# Patient Record
Sex: Female | Born: 1990 | Race: White | Hispanic: No | Marital: Single | Smoking: Current every day smoker
Health system: Southern US, Community
[De-identification: ages and names within clinical notes are randomized; demographics above are authoritative.]

## PROBLEM LIST (undated history)

## (undated) ENCOUNTER — Inpatient Hospital Stay (HOSPITAL_COMMUNITY): Payer: Self-pay

## (undated) DIAGNOSIS — F319 Bipolar disorder, unspecified: Secondary | ICD-10-CM

## (undated) DIAGNOSIS — O09299 Supervision of pregnancy with other poor reproductive or obstetric history, unspecified trimester: Secondary | ICD-10-CM

## (undated) DIAGNOSIS — O139 Gestational [pregnancy-induced] hypertension without significant proteinuria, unspecified trimester: Secondary | ICD-10-CM

## (undated) HISTORY — PX: TONSILLECTOMY: SUR1361

## (undated) HISTORY — PX: TUBAL LIGATION: SHX77

---
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
## 2011-12-21 ENCOUNTER — Encounter (HOSPITAL_COMMUNITY): Payer: Self-pay | Admitting: *Deleted

## 2011-12-21 ENCOUNTER — Inpatient Hospital Stay (HOSPITAL_COMMUNITY): Payer: Self-pay

## 2011-12-21 ENCOUNTER — Inpatient Hospital Stay (HOSPITAL_COMMUNITY)
Admission: EM | Admit: 2011-12-21 | Discharge: 2011-12-21 | Disposition: A | Discharge status: Home / Self Care | Payer: Self-pay | Attending: Obstetrics and Gynecology | Admitting: Obstetrics and Gynecology

## 2011-12-21 DIAGNOSIS — Z3689 Encounter for other specified antenatal screening: Secondary | ICD-10-CM

## 2011-12-21 DIAGNOSIS — O4700 False labor before 37 completed weeks of gestation, unspecified trimester: Secondary | ICD-10-CM | POA: Insufficient documentation

## 2011-12-21 DIAGNOSIS — O99891 Other specified diseases and conditions complicating pregnancy: Secondary | ICD-10-CM | POA: Insufficient documentation

## 2011-12-21 DIAGNOSIS — Z3490 Encounter for supervision of normal pregnancy, unspecified, unspecified trimester: Secondary | ICD-10-CM

## 2011-12-21 HISTORY — DX: Bipolar disorder, unspecified: F31.9

## 2011-12-21 LAB — URINALYSIS, ROUTINE W REFLEX MICROSCOPIC
Bilirubin Urine: NEGATIVE
Ketones, ur: NEGATIVE mg/dL
Leukocytes, UA: NEGATIVE
Nitrite: NEGATIVE
Protein, ur: NEGATIVE mg/dL
Urobilinogen, UA: 1 mg/dL (ref 0.0–1.0)

## 2011-12-21 MED ORDER — LACTATED RINGERS IV SOLN
INTRAVENOUS | Status: DC
  Administered 2011-12-21: 06:00:00 via INTRAVENOUS

## 2011-12-21 NOTE — ED Notes (Signed)
[facility] transported patient to [facility].

## 2011-12-21 NOTE — MAU Provider Note (Signed)
  History     CSN: [id number]  Arrival date and time: 12/21/11 4098   First Provider Initiated Contact with Patient 12/21/11 0550      Chief Complaint  Patient presents with  . Abdominal Pain  . [redacted] weeks pregnant   . Rupture of Membranes   HPI This is a 21 y.o. female at [redacted]w[redacted]d who presents via [facility] from [facility] ED.  Was in bathroom straining for BM and passed a "white glob" of mucous.  No leaking of watery fluid. No bleeding. Has some contractions which she feels in her back. Has been 2-3 cm in office. Gets care in [place], [place].  FHR was reportedly 115 baseline rate and nonreactive there, so they recommended she come here for evaluation. Dr Claiborne Billings responsible.   OB History    Grav Para Term Preterm Abortions TAB SAB Ect Mult Living   3 2 1 1      2       Past Medical History  Diagnosis Date  . Hypertension   . Manic-depressive disorder     Past Surgical History  Procedure Date  . Tonsillectomy     History reviewed. No pertinent family history.  History  Substance Use Topics  . Smoking status: Current Everyday Smoker -- 0.5 packs/day    Types: Cigarettes  . Smokeless tobacco: Not on file  . Alcohol Use: No    Allergies: No Known Allergies  No prescriptions prior to admission    ROS As in HPI  Physical Exam   Blood pressure 109/60, pulse 57, temperature 97 F (36.1 C), temperature source Oral, resp. rate 16, SpO2 100.00%.  Physical Exam  Constitutional: She is oriented to person, place, and time. She appears well-developed and well-nourished. No distress.  HENT:  Head: Normocephalic.  Cardiovascular: Normal rate.   Respiratory: Effort normal.  GI: Soft. She exhibits no distension and no mass. There is no tenderness. There is no rebound and no guarding.  Genitourinary: Uterus normal. Vaginal discharge (milky white) found.       +/- ferning.  Not typical of amniotic fern  Amnisure collected  Musculoskeletal: Normal range of motion. She exhibits no  edema.  Neurological: She is alert and oriented to person, place, and time.  Skin: Skin is warm and dry.  Psychiatric: She has a normal mood and affect.   FHR 120-125 baseline with several prolonged accelerations noted to 140-150 >> Reactive UCs irregular about every 8-10 minutes  MAU Course  Procedures  Assessment and Plan  A:  SIUP at 105w2d       Mucous plug      Reactive FHR tracing P:  Discharge home.       Discussed with pt and FOB      They will followup with their doctor at home.  Wynelle Bourgeois 12/21/2011, 6:22 AM   Just before coming off monitor, there were 3 sharp variable decels lasting only 20-30 seconds Will get BPP and AFI. Discussed with Dr Claiborne Billings.  Care turned over to [employer] CNM who will review results.

## 2011-12-21 NOTE — ED Notes (Signed)
Pt states one hour ago, went to bathroom and "thinks water broke", saw white "mucus like stuff".  C/o lower back pain that wraps around to abdomen.  Pain is intermittent and irregular.  3rd pregnancy

## 2011-12-21 NOTE — MAU Note (Signed)
Pt unable to respond to questions asked by the nurse. Pt sleeping, mother at bedside. Says patient took 1/2 tab of hydrocodone this morning and is sure she did not take more than that. Plan of care discussed with mom

## 2011-12-21 NOTE — ED Provider Notes (Signed)
History     CSN: [id number]  Arrival date & time 12/21/11  4098   First MD Initiated Contact with Patient 12/21/11 0408      Chief Complaint  Patient presents with  . Abdominal Pain  . [redacted] weeks pregnant     (Consider location/radiation/quality/duration/timing/severity/associated sxs/prior treatment) HPI Pt is G3 P2 at 35 weeks p/w ? water breaking and irregular contractions form back moving to stomach. She has had none since arriving to her room. No fever, chills. +active fetal movement. Past Medical History  Diagnosis Date  . Hypertension     Past Surgical History  Procedure Date  . Tonsillectomy     History reviewed. No pertinent family history.  History  Substance Use Topics  . Smoking status: Current Everyday Smoker -- 0.5 packs/day  . Smokeless tobacco: Not on file  . Alcohol Use: No    OB History    Grav Para Term Preterm Abortions TAB SAB Ect Mult Living                  Review of Systems  Constitutional: Negative for fever and chills.  Gastrointestinal: Positive for abdominal pain. Negative for nausea, vomiting and diarrhea.  Genitourinary: Positive for vaginal discharge and pelvic pain. Negative for vaginal bleeding and vaginal pain.  Musculoskeletal: Positive for back pain.    Allergies  Review of patient's allergies indicates no known allergies.  Home Medications  No current outpatient prescriptions on file.  BP 166/62  Pulse 74  Temp 98.1 F (36.7 C) (Oral)  Resp 14  SpO2 99%  Physical Exam  Nursing note and vitals reviewed. Constitutional: She is oriented to person, place, and time. She appears well-developed and well-nourished. No distress.  HENT:  Head: Normocephalic and atraumatic.  Mouth/Throat: Oropharynx is clear and moist.  Eyes: EOM are normal. Pupils are equal, round, and reactive to light.  Neck: Normal range of motion. Neck supple.  Cardiovascular: Normal rate and regular rhythm.   Pulmonary/Chest: Effort normal and  breath sounds normal. No respiratory distress. She has no wheezes. She has no rales.  Abdominal: Soft. Bowel sounds are normal. She exhibits distension and mass. There is no tenderness. There is no rebound and no guarding.       Gravid NT abd with visible fetal movements  Musculoskeletal: Normal range of motion. She exhibits no edema and no tenderness.  Neurological: She is alert and oriented to person, place, and time.  Skin: Skin is warm and dry. No rash noted. No erythema.  Psychiatric: She has a normal mood and affect. Her behavior is normal.    ED Course  Procedures (including critical care time)  Labs Reviewed - No data to display No results found.   1. Irregular contractions   2. Pregnancy     FHT 130's. Discussed with Dr Claiborne Billings. Advised delay sterile cervical check until patient has been transferred. No active contractions. Dr Claiborne Billings accepts in transfer  MDM          Loren Racer, MD 12/21/11 [phone]

## 2011-12-21 NOTE — MAU Note (Signed)
Patient is brought in by [facility]. She lives in [place] and is visiting her family in [place]. She states that after eating chinese food last evening, she started vomiting and felt a pinkish, thin discharge. She states that her mother told her to come to the hospital. She c/o mild abdominal cramping. Reports good fetal movement.

## 2011-12-21 NOTE — ED Notes (Signed)
R[facility] OB (Cathy) now at bedside.

## 2011-12-21 NOTE — ED Notes (Addendum)
Patient felt that she either passed a mucous plug tonight (about an hour ago), or had her water break.  Patient had ultrasound last Tuesday; baby was measured at 35-36 weeks; placenta was at 37 weeks (due to patient being a smoker).  Baby's head in ultrasound.  Patient was dilated 2-3 cm at last  Ultrasound reading.  Patient started taking blood pressure medications on Tuesday due to hypertension; patient reports history of pre-eclampsia with previous pregnancies. Patient reporting lower back pain; feels that contractions are irregular.  This is patient's 3rd pregnancy (previous labors 36 hours, and 9 hours).  Patient reports lots of fetal movement.  Dr. Ranae Palms at bedside.  Upon arrival to room, patient changed into gown and connected to continuous cardiac, pulse ox, and blood pressure monitor.  Bedside fetal monitoring set up at bedside.  Will continue to monitor.

## 2011-12-21 NOTE — ED Notes (Signed)
Patient being transferred to [facility]; [facility] called by [employer] operational officer.

## 2011-12-25 DIAGNOSIS — I10 Essential (primary) hypertension: Secondary | ICD-10-CM

## 2013-02-14 ENCOUNTER — Emergency Department (HOSPITAL_COMMUNITY): Payer: Medicaid - Out of State

## 2013-02-14 ENCOUNTER — Emergency Department (HOSPITAL_COMMUNITY)
Admission: EM | Admit: 2013-02-14 | Discharge: 2013-02-14 | Disposition: A | Discharge status: Home / Self Care | Payer: Medicaid - Out of State | Attending: Emergency Medicine | Admitting: Emergency Medicine

## 2013-02-14 ENCOUNTER — Encounter (HOSPITAL_COMMUNITY): Payer: Self-pay | Admitting: Emergency Medicine

## 2013-02-14 DIAGNOSIS — S9030XA Contusion of unspecified foot, initial encounter: Secondary | ICD-10-CM | POA: Insufficient documentation

## 2013-02-14 DIAGNOSIS — S92302A Fracture of unspecified metatarsal bone(s), left foot, initial encounter for closed fracture: Secondary | ICD-10-CM

## 2013-02-14 DIAGNOSIS — S139XXA Sprain of joints and ligaments of unspecified parts of neck, initial encounter: Secondary | ICD-10-CM | POA: Insufficient documentation

## 2013-02-14 DIAGNOSIS — R509 Fever, unspecified: Secondary | ICD-10-CM | POA: Insufficient documentation

## 2013-02-14 DIAGNOSIS — S161XXA Strain of muscle, fascia and tendon at neck level, initial encounter: Secondary | ICD-10-CM

## 2013-02-14 DIAGNOSIS — Y92410 Unspecified street and highway as the place of occurrence of the external cause: Secondary | ICD-10-CM | POA: Insufficient documentation

## 2013-02-14 DIAGNOSIS — Y939 Activity, unspecified: Secondary | ICD-10-CM | POA: Insufficient documentation

## 2013-02-14 DIAGNOSIS — IMO0002 Reserved for concepts with insufficient information to code with codable children: Secondary | ICD-10-CM | POA: Insufficient documentation

## 2013-02-14 DIAGNOSIS — F17200 Nicotine dependence, unspecified, uncomplicated: Secondary | ICD-10-CM | POA: Insufficient documentation

## 2013-02-14 DIAGNOSIS — S4980XA Other specified injuries of shoulder and upper arm, unspecified arm, initial encounter: Secondary | ICD-10-CM | POA: Insufficient documentation

## 2013-02-14 DIAGNOSIS — S46909A Unspecified injury of unspecified muscle, fascia and tendon at shoulder and upper arm level, unspecified arm, initial encounter: Secondary | ICD-10-CM | POA: Insufficient documentation

## 2013-02-14 LAB — CBC WITH DIFFERENTIAL/PLATELET
Basophils Absolute: 0 10*3/uL (ref 0.0–0.1)
Basophils Relative: 0 % (ref 0–1)
Eosinophils Absolute: 0.1 10*3/uL (ref 0.0–0.7)
Eosinophils Relative: 2 % (ref 0–5)
HCT: 40.8 % (ref 36.0–46.0)
Hemoglobin: 14.5 g/dL (ref 12.0–15.0)
Lymphocytes Relative: 33 % (ref 12–46)
Lymphs Abs: 3.1 10*3/uL (ref 0.7–4.0)
MCH: 32.9 pg (ref 26.0–34.0)
MCHC: 35.5 g/dL (ref 30.0–36.0)
MCV: 92.5 fL (ref 78.0–100.0)
Monocytes Absolute: 0.9 10*3/uL (ref 0.1–1.0)
Monocytes Relative: 10 % (ref 3–12)
Neutro Abs: 5.3 10*3/uL (ref 1.7–7.7)
Neutrophils Relative %: 56 % (ref 43–77)
Platelets: 331 10*3/uL (ref 150–400)
RBC: 4.41 MIL/uL (ref 3.87–5.11)
RDW: 12 % (ref 11.5–15.5)
WBC: 9.5 10*3/uL (ref 4.0–10.5)

## 2013-02-14 LAB — BASIC METABOLIC PANEL
BUN: 5 mg/dL — ABNORMAL LOW (ref 6–23)
CO2: 26 mEq/L (ref 19–32)
Chloride: 99 mEq/L (ref 96–112)
GFR calc non Af Amer: >90 mL/min (ref >90)
Glucose, Bld: 94 mg/dL (ref 70–99)
Potassium: 3.4 mEq/L — ABNORMAL LOW (ref 3.5–5.1)
Sodium: 137 mEq/L (ref 135–145)

## 2013-02-14 MED ORDER — IBUPROFEN 800 MG PO TABS
ORAL_TABLET | ORAL | Status: AC
  Administered 2013-02-14: 800 mg via ORAL
  Filled 2013-02-14: qty 1

## 2013-02-14 MED ORDER — OXYCODONE-ACETAMINOPHEN 5-325 MG PO TABS: 2.0000 | ORAL_TABLET | ORAL | Status: DC | PRN

## 2013-02-14 MED ORDER — IBUPROFEN 800 MG PO TABS
800.0000 mg | ORAL_TABLET | Freq: Once | ORAL | Status: AC
  Administered 2013-02-14: 800 mg via ORAL

## 2013-02-14 MED ORDER — MORPHINE SULFATE 4 MG/ML IJ SOLN
4.0000 mg | Freq: Once | INTRAMUSCULAR | Status: AC
  Administered 2013-02-14: 4 mg via INTRAVENOUS
  Filled 2013-02-14: qty 1

## 2013-02-14 MED ORDER — MORPHINE SULFATE 4 MG/ML IJ SOLN
INTRAMUSCULAR | Status: AC
  Filled 2013-02-14: qty 1

## 2013-02-14 MED ORDER — ONDANSETRON HCL 4 MG/2ML IJ SOLN
4.0000 mg | Freq: Once | INTRAMUSCULAR | Status: AC
  Administered 2013-02-14: 4 mg via INTRAMUSCULAR
  Filled 2013-02-14: qty 2

## 2013-02-14 NOTE — ED Notes (Signed)
Patient with no complaints at this time. Respirations even and unlabored. Skin warm/dry. Discharge instructions reviewed with patient at this time. Patient given opportunity to voice concerns/ask questions. IV removed per policy and band-aid applied to site. Patient discharged at this time and left Emergency Department with steady gait.  

## 2013-02-14 NOTE — ED Notes (Signed)
Aspen collar applied using spinal precautions
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
## 2013-02-14 NOTE — ED Provider Notes (Signed)
CT scan of left foot reveals a nondisplaced fracture of the third metatarsal. This was discussed with patient and her husband. She has orthopedic followup  Donnetta Hutching, MD 02/14/13 1157

## 2013-02-23 ENCOUNTER — Encounter (HOSPITAL_COMMUNITY): Payer: Self-pay | Admitting: *Deleted

## 2014-02-28 ENCOUNTER — Encounter (HOSPITAL_COMMUNITY): Payer: Self-pay | Admitting: *Deleted

## 2014-08-18 ENCOUNTER — Emergency Department
Admit: 2014-08-18 | Disposition: A | Discharge status: Home / Self Care | Payer: Self-pay | Admitting: Emergency Medicine

## 2014-08-18 LAB — URINALYSIS, COMPLETE
BACTERIA: NONE SEEN
BLOOD: NEGATIVE
Bilirubin,UR: NEGATIVE
GLUCOSE, UR: NEGATIVE mg/dL (ref 0–75)
Ketone: NEGATIVE
LEUKOCYTE ESTERASE: NEGATIVE
NITRITE: NEGATIVE
PH: 5 (ref 4.5–8.0)
Protein: NEGATIVE
SPECIFIC GRAVITY: 1.029 (ref 1.003–1.030)

## 2014-08-18 LAB — COMPREHENSIVE METABOLIC PANEL
ALBUMIN: 4.5 g/dL
ALK PHOS: 40 U/L
ALT: 12 U/L — AB
ANION GAP: 6 — AB (ref 7–16)
AST: 16 U/L
BUN: 12 mg/dL
Bilirubin,Total: 0.6 mg/dL
CALCIUM: 8.6 mg/dL — AB
CHLORIDE: 104 mmol/L
CO2: 27 mmol/L
Creatinine: 0.43 mg/dL — ABNORMAL LOW
EGFR (Non-African Amer.): >60
GLUCOSE: 113 mg/dL — AB
POTASSIUM: 3.8 mmol/L
SODIUM: 137 mmol/L
Total Protein: 7.3 g/dL

## 2014-08-18 LAB — CBC
HCT: 38.5 % (ref 35.0–47.0)
HGB: 12.9 g/dL (ref 12.0–16.0)
MCH: 31.4 pg (ref 26.0–34.0)
MCHC: 33.6 g/dL (ref 32.0–36.0)
MCV: 93 fL (ref 80–100)
PLATELETS: 296 10*3/uL (ref 150–440)
RBC: 4.12 10*6/uL (ref 3.80–5.20)
RDW: 12.5 % (ref 11.5–14.5)
WBC: 8 10*3/uL (ref 3.6–11.0)

## 2014-08-18 LAB — DRUG SCREEN, URINE
Amphetamines, Ur Screen: NEGATIVE
BARBITURATES, UR SCREEN: NEGATIVE
BENZODIAZEPINE, UR SCRN: POSITIVE
CANNABINOID 50 NG, UR ~~LOC~~: POSITIVE
Cocaine Metabolite,Ur ~~LOC~~: NEGATIVE
MDMA (Ecstasy)Ur Screen: NEGATIVE
METHADONE, UR SCREEN: NEGATIVE
Opiate, Ur Screen: NEGATIVE
PHENCYCLIDINE (PCP) UR S: NEGATIVE
Tricyclic, Ur Screen: NEGATIVE

## 2014-08-18 LAB — ACETAMINOPHEN LEVEL

## 2014-08-18 LAB — SALICYLATE LEVEL: Salicylates, Serum: <4 mg/dL

## 2014-08-18 LAB — ETHANOL

## 2015-02-06 ENCOUNTER — Other Ambulatory Visit: Payer: Self-pay | Admitting: Obstetrics & Gynecology

## 2015-02-06 DIAGNOSIS — O3680X0 Pregnancy with inconclusive fetal viability, not applicable or unspecified: Secondary | ICD-10-CM

## 2015-02-07 ENCOUNTER — Other Ambulatory Visit: Payer: Self-pay | Admitting: Obstetrics & Gynecology

## 2015-02-07 ENCOUNTER — Ambulatory Visit (INDEPENDENT_AMBULATORY_CARE_PROVIDER_SITE_OTHER): Payer: Medicaid Other

## 2015-02-07 DIAGNOSIS — O3680X0 Pregnancy with inconclusive fetal viability, not applicable or unspecified: Secondary | ICD-10-CM

## 2015-02-07 DIAGNOSIS — Z3A28 28 weeks gestation of pregnancy: Secondary | ICD-10-CM | POA: Diagnosis not present

## 2015-02-07 DIAGNOSIS — Z1389 Encounter for screening for other disorder: Secondary | ICD-10-CM

## 2015-02-07 DIAGNOSIS — Z36 Encounter for antenatal screening of mother: Secondary | ICD-10-CM | POA: Diagnosis not present

## 2015-02-07 NOTE — Progress Notes (Signed)
[place] 28+6wks,efw 1242 g,cephalic,afi 14.4cm,fhr 128 bpm,post pl gr 1,normal ov's bilat,cx 4.5cm,anatomy complete no obvious abn seen

## 2015-02-08 ENCOUNTER — Encounter: Payer: Medicaid Other | Admitting: Advanced Practice Midwife

## 2015-02-15 ENCOUNTER — Encounter: Payer: Self-pay | Admitting: Advanced Practice Midwife

## 2015-02-15 ENCOUNTER — Ambulatory Visit (INDEPENDENT_AMBULATORY_CARE_PROVIDER_SITE_OTHER): Payer: Medicaid Other | Admitting: Advanced Practice Midwife

## 2015-02-15 VITALS — BP 110/70 | HR 68 | Wt 123.0 lb

## 2015-02-15 DIAGNOSIS — O0993 Supervision of high risk pregnancy, unspecified, third trimester: Secondary | ICD-10-CM

## 2015-02-15 DIAGNOSIS — O09293 Supervision of pregnancy with other poor reproductive or obstetric history, third trimester: Secondary | ICD-10-CM

## 2015-02-15 DIAGNOSIS — O09299 Supervision of pregnancy with other poor reproductive or obstetric history, unspecified trimester: Secondary | ICD-10-CM | POA: Insufficient documentation

## 2015-02-15 DIAGNOSIS — O09213 Supervision of pregnancy with history of pre-term labor, third trimester: Secondary | ICD-10-CM | POA: Diagnosis not present

## 2015-02-15 DIAGNOSIS — Z331 Pregnant state, incidental: Secondary | ICD-10-CM | POA: Diagnosis not present

## 2015-02-15 DIAGNOSIS — Z3493 Encounter for supervision of normal pregnancy, unspecified, third trimester: Secondary | ICD-10-CM

## 2015-02-15 DIAGNOSIS — Z369 Encounter for antenatal screening, unspecified: Secondary | ICD-10-CM

## 2015-02-15 DIAGNOSIS — Z1389 Encounter for screening for other disorder: Secondary | ICD-10-CM | POA: Diagnosis not present

## 2015-02-15 DIAGNOSIS — Z3A30 30 weeks gestation of pregnancy: Secondary | ICD-10-CM

## 2015-02-15 DIAGNOSIS — O09893 Supervision of other high risk pregnancies, third trimester: Secondary | ICD-10-CM

## 2015-02-15 DIAGNOSIS — Z3490 Encounter for supervision of normal pregnancy, unspecified, unspecified trimester: Secondary | ICD-10-CM | POA: Insufficient documentation

## 2015-02-15 DIAGNOSIS — O0933 Supervision of pregnancy with insufficient antenatal care, third trimester: Secondary | ICD-10-CM | POA: Insufficient documentation

## 2015-02-15 LAB — POCT URINALYSIS DIPSTICK
Blood, UA: NEGATIVE
Glucose, UA: NEGATIVE
KETONES UA: NEGATIVE
LEUKOCYTES UA: NEGATIVE
Nitrite, UA: NEGATIVE

## 2015-02-15 MED ORDER — AMOXICILLIN 500 MG PO CAPS: 500.0000 mg | ORAL_CAPSULE | Freq: Three times a day (TID) | ORAL | Status: DC
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
## 2015-02-15 NOTE — Patient Instructions (Addendum)
Safe Medications in Pregnancy   Acne: Benzoyl Peroxide Salicylic Acid  Backache/Headache: Tylenol: 2 regular strength every 4 hours OR              2 Extra strength every 6 hours  Colds/Coughs/Allergies: Benadryl (alcohol free) 25 mg every 6 hours as needed Breath right strips Claritin Cepacol throat lozenges Chloraseptic throat spray Cold-Eeze- up to three times per day Cough drops, alcohol free Flonase (by prescription only) Guaifenesin Mucinex Robitussin DM (plain only, alcohol free) Saline nasal spray/drops Sudafed (pseudoephedrine) & Actifed ** use only after [redacted] weeks gestation and if you do not have high blood pressure Tylenol Vicks Vaporub Zinc lozenges Zyrtec   Constipation: Colace Ducolax suppositories Fleet enema Glycerin suppositories Metamucil Milk of magnesia Miralax Senokot Smooth move tea  Diarrhea: Kaopectate Imodium A-D  *NO pepto Bismol  Hemorrhoids: Anusol Anusol HC Preparation H Tucks  Indigestion: Tums Maalox Mylanta Zantac  Pepcid  Insomnia: Benadryl (alcohol free)  every 6 hours as needed Tylenol PM Unisom, no Gelcaps  Leg Cramps: Tums MagGel  Nausea/Vomiting:  Bonine Dramamine Emetrol Ginger extract Sea bands Meclizine  Nausea medication to take during pregnancy:  Unisom (doxylamine succinate 25 mg tablets) Take one tablet daily at bedtime. If symptoms are not adequately controlled, the dose can be increased to a maximum recommended dose of two tablets daily (1/2 tablet in the morning, 1/2 tablet mid-afternoon and one at bedtime). Vitamin B6  tablets. Take one tablet twice a day (up to 200 mg per day).  Skin Rashes: Aveeno products Benadryl cream or  every 6 hours as needed Calamine Lotion 1% cortisone cream  Yeast infection: Gyne-lotrimin 7 Monistat 7   **If taking multiple medications, please check labels to avoid duplicating the same active ingredients **take medication as directed on  the label ** Do not exceed 4000 mg of tylenol in 24 hours **Do not take medications that contain aspirin or ibuprofen    Kinesiology taping for pregnancy:  Youtube has good vidoes of "how tos" for lower back, pelvic, hip pain; swelling of feet, etc   1. Before your test, do not eat or drink anything for 8-10 hours prior to your  appointment (a small amount of water is allowed and you may take any medicines you normally take). Be sure to drink lots of water the day before. 2. When you arrive, your blood will be drawn for a 'fasting' blood sugar level.  Then you will be given a sweetened carbonated beverage to drink. You should  complete drinking this beverage within five minutes. After finishing the  beverage, you will have your blood drawn exactly 1 and 2 hours later. Having  your blood drawn on time is an important part of this test. A total of three blood  samples will be done. 3. The test takes approximately 2  hours. During the test, do not have anything to  eat or drink. Do not smoke, chew gum (not even sugarless gum) or use breath mints.  4. During the test you should remain close by and seated as much as possible and  avoid walking around. You may want to bring a book or something else to  occupy your time.  5. After your test, you may eat and drink as normal. You may want to bring a snack  to eat after the test is finished. Your provider will advise you as to the results of  this test and any follow-up if necessary  If your sugar test is positive for gestational  diabetes, you will be given an phone call and further instructions discussed. If you wish to know all of your test results before your next appointment, feel free to call the office, or look up your test results on Mychart.  (The range that the lab uses for normal values of the sugar test are not necessarily the range that is used for pregnant women; if your results are within the normal range, they are definitely normal.   However, if a value is deemed "high" by the lab, it may not be too high for a pregnant woman.  We will need to discuss the results if your value(s) fall in the "high" category).     Tdap Vaccine  It is recommended that you get the Tdap vaccine during the third trimester of EACH pregnancy to help protect your baby from getting pertussis (whooping cough)  27-36 weeks is the BEST time to do this so that you can pass the protection on to your baby. During pregnancy is better than after pregnancy, but if you are unable to get it during pregnancy it will be offered at the hospital.  You can get this vaccine at the health department or your family doctor, as well as some pharmacies.  Everyone who will be around your baby should also be up-to-date on their vaccines. Adults (who are not pregnant) only need 1 dose of Tdap during adulthood.

## 2015-02-16 LAB — GC/CHLAMYDIA PROBE AMP
Chlamydia trachomatis, NAA: NEGATIVE
NEISSERIA GONORRHOEAE BY PCR: NEGATIVE

## 2015-02-17 LAB — URINE CULTURE

## 2015-03-02 ENCOUNTER — Encounter: Payer: Medicaid Other | Admitting: Advanced Practice Midwife

## 2015-03-08 ENCOUNTER — Encounter: Payer: Medicaid Other | Admitting: Obstetrics and Gynecology

## 2015-03-10 ENCOUNTER — Inpatient Hospital Stay (HOSPITAL_COMMUNITY)
Admission: AD | Admit: 2015-03-10 | Discharge: 2015-03-12 | DRG: 781 | Disposition: A | Discharge status: Home / Self Care | Payer: Medicaid Other | Source: Ambulatory Visit | Attending: Obstetrics & Gynecology | Admitting: Obstetrics & Gynecology

## 2015-03-10 ENCOUNTER — Encounter: Payer: Self-pay | Admitting: Obstetrics and Gynecology

## 2015-03-10 ENCOUNTER — Other Ambulatory Visit: Payer: Self-pay | Admitting: Obstetrics and Gynecology

## 2015-03-10 ENCOUNTER — Encounter (HOSPITAL_COMMUNITY): Payer: Self-pay | Admitting: *Deleted

## 2015-03-10 ENCOUNTER — Ambulatory Visit (INDEPENDENT_AMBULATORY_CARE_PROVIDER_SITE_OTHER): Payer: Medicaid Other | Admitting: Obstetrics and Gynecology

## 2015-03-10 VITALS — BP 140/102 | HR 88 | Wt 131.0 lb

## 2015-03-10 DIAGNOSIS — Z1389 Encounter for screening for other disorder: Secondary | ICD-10-CM | POA: Diagnosis not present

## 2015-03-10 DIAGNOSIS — O133 Gestational [pregnancy-induced] hypertension without significant proteinuria, third trimester: Secondary | ICD-10-CM | POA: Diagnosis present

## 2015-03-10 DIAGNOSIS — F17210 Nicotine dependence, cigarettes, uncomplicated: Secondary | ICD-10-CM | POA: Diagnosis present

## 2015-03-10 DIAGNOSIS — O09893 Supervision of other high risk pregnancies, third trimester: Secondary | ICD-10-CM | POA: Diagnosis not present

## 2015-03-10 DIAGNOSIS — O0933 Supervision of pregnancy with insufficient antenatal care, third trimester: Secondary | ICD-10-CM | POA: Diagnosis not present

## 2015-03-10 DIAGNOSIS — O212 Late vomiting of pregnancy: Secondary | ICD-10-CM | POA: Diagnosis present

## 2015-03-10 DIAGNOSIS — Z3493 Encounter for supervision of normal pregnancy, unspecified, third trimester: Secondary | ICD-10-CM

## 2015-03-10 DIAGNOSIS — Z3A33 33 weeks gestation of pregnancy: Secondary | ICD-10-CM | POA: Diagnosis not present

## 2015-03-10 DIAGNOSIS — O09213 Supervision of pregnancy with history of pre-term labor, third trimester: Secondary | ICD-10-CM

## 2015-03-10 DIAGNOSIS — O99333 Smoking (tobacco) complicating pregnancy, third trimester: Secondary | ICD-10-CM | POA: Diagnosis present

## 2015-03-10 DIAGNOSIS — R030 Elevated blood-pressure reading, without diagnosis of hypertension: Secondary | ICD-10-CM | POA: Diagnosis not present

## 2015-03-10 DIAGNOSIS — Z331 Pregnant state, incidental: Secondary | ICD-10-CM | POA: Diagnosis not present

## 2015-03-10 DIAGNOSIS — O99613 Diseases of the digestive system complicating pregnancy, third trimester: Secondary | ICD-10-CM | POA: Diagnosis present

## 2015-03-10 DIAGNOSIS — O09293 Supervision of pregnancy with other poor reproductive or obstetric history, third trimester: Secondary | ICD-10-CM | POA: Diagnosis not present

## 2015-03-10 DIAGNOSIS — O09299 Supervision of pregnancy with other poor reproductive or obstetric history, unspecified trimester: Secondary | ICD-10-CM

## 2015-03-10 DIAGNOSIS — E876 Hypokalemia: Secondary | ICD-10-CM | POA: Diagnosis present

## 2015-03-10 DIAGNOSIS — O163 Unspecified maternal hypertension, third trimester: Secondary | ICD-10-CM | POA: Diagnosis not present

## 2015-03-10 DIAGNOSIS — K219 Gastro-esophageal reflux disease without esophagitis: Secondary | ICD-10-CM | POA: Diagnosis present

## 2015-03-10 LAB — COMPREHENSIVE METABOLIC PANEL
ALT: 11 U/L — AB (ref 14–54)
ANION GAP: 7 (ref 5–15)
AST: 15 U/L (ref 15–41)
Albumin: 3 g/dL — ABNORMAL LOW (ref 3.5–5.0)
Alkaline Phosphatase: 66 U/L (ref 38–126)
BUN: <5 mg/dL — ABNORMAL LOW (ref 6–20)
CHLORIDE: 107 mmol/L (ref 101–111)
CO2: 24 mmol/L (ref 22–32)
Calcium: 8.7 mg/dL — ABNORMAL LOW (ref 8.9–10.3)
Creatinine, Ser: 0.4 mg/dL — ABNORMAL LOW (ref 0.44–1.00)
GFR calc non Af Amer: >60 mL/min (ref >60)
Glucose, Bld: 112 mg/dL — ABNORMAL HIGH (ref 65–99)
POTASSIUM: 2.7 mmol/L — AB (ref 3.5–5.1)
SODIUM: 138 mmol/L (ref 135–145)
Total Bilirubin: 0.4 mg/dL (ref 0.3–1.2)
Total Protein: 6 g/dL — ABNORMAL LOW (ref 6.5–8.1)

## 2015-03-10 LAB — POCT URINALYSIS DIPSTICK
Blood, UA: NEGATIVE
Glucose, UA: NEGATIVE
KETONES UA: NEGATIVE
LEUKOCYTES UA: NEGATIVE
Nitrite, UA: NEGATIVE
PROTEIN UA: NEGATIVE

## 2015-03-10 LAB — CBC
HCT: 27.9 % — ABNORMAL LOW (ref 36.0–46.0)
Hemoglobin: 9.8 g/dL — ABNORMAL LOW (ref 12.0–15.0)
MCH: 31.6 pg (ref 26.0–34.0)
MCHC: 35.1 g/dL (ref 30.0–36.0)
MCV: 90 fL (ref 78.0–100.0)
PLATELETS: 214 10*3/uL (ref 150–400)
RBC: 3.1 MIL/uL — AB (ref 3.87–5.11)
RDW: 12.9 % (ref 11.5–15.5)
WBC: 8.8 10*3/uL (ref 4.0–10.5)

## 2015-03-10 LAB — URINALYSIS, ROUTINE W REFLEX MICROSCOPIC
Bilirubin Urine: NEGATIVE
Glucose, UA: NEGATIVE mg/dL
Hgb urine dipstick: NEGATIVE
KETONES UR: NEGATIVE mg/dL
NITRITE: NEGATIVE
PH: 6.5 (ref 5.0–8.0)
Protein, ur: NEGATIVE mg/dL
Specific Gravity, Urine: 1.015 (ref 1.005–1.030)
Urobilinogen, UA: 0.2 mg/dL (ref 0.0–1.0)

## 2015-03-10 LAB — PROTEIN / CREATININE RATIO, URINE
Creatinine, Urine: 77 mg/dL
PROTEIN CREATININE RATIO: 0.16 mg/mg{creat} — AB (ref 0.00–0.15)
TOTAL PROTEIN, URINE: 12 mg/dL

## 2015-03-10 LAB — TYPE AND SCREEN
ABO/RH(D): A POS
ANTIBODY SCREEN: NEGATIVE

## 2015-03-10 LAB — RAPID URINE DRUG SCREEN, HOSP PERFORMED
Amphetamines: NOT DETECTED
BARBITURATES: NOT DETECTED
Benzodiazepines: NOT DETECTED
Cocaine: NOT DETECTED
Opiates: POSITIVE — AB
Tetrahydrocannabinol: POSITIVE — AB

## 2015-03-10 LAB — URINE MICROSCOPIC-ADD ON

## 2015-03-10 MED ORDER — BUTALBITAL-APAP-CAFFEINE 50-325-40 MG PO TABS
2.0000 | ORAL_TABLET | Freq: Four times a day (QID) | ORAL | Status: DC | PRN
Start: 2015-03-10 — End: 2015-03-12
  Administered 2015-03-10 – 2015-03-11 (×5): 2 via ORAL
  Filled 2015-03-10 (×5): qty 2

## 2015-03-10 MED ORDER — POTASSIUM CHLORIDE 2 MEQ/ML IV SOLN
INTRAVENOUS | Status: DC
  Administered 2015-03-11 – 2015-03-12 (×3): via INTRAVENOUS
  Filled 2015-03-10 (×6): qty 1000

## 2015-03-10 MED ORDER — DOCUSATE SODIUM 100 MG PO CAPS
100.0000 mg | ORAL_CAPSULE | Freq: Every day | ORAL | Status: DC
  Administered 2015-03-11: 100 mg via ORAL
  Filled 2015-03-10: qty 1

## 2015-03-10 MED ORDER — POTASSIUM CHLORIDE 10 MEQ/100ML IV SOLN
10.0000 meq | INTRAVENOUS | Status: DC
  Filled 2015-03-10 (×2): qty 100

## 2015-03-10 MED ORDER — POTASSIUM CHLORIDE 10 MEQ/100ML IV SOLN
10.0000 meq | INTRAVENOUS | Status: AC
  Administered 2015-03-10 – 2015-03-11 (×4): 10 meq via INTRAVENOUS
  Filled 2015-03-10 (×4): qty 100

## 2015-03-10 MED ORDER — ACETAMINOPHEN 325 MG PO TABS: 650.0000 mg | ORAL_TABLET | ORAL | Status: DC | PRN

## 2015-03-10 MED ORDER — POTASSIUM CHLORIDE 20 MEQ PO PACK: 40.0000 meq | PACK | Freq: Once | ORAL | Status: DC

## 2015-03-10 MED ORDER — PRENATAL MULTIVITAMIN CH
1.0000 | ORAL_TABLET | Freq: Every day | ORAL | Status: DC
  Administered 2015-03-11: 1 via ORAL
  Filled 2015-03-10: qty 1

## 2015-03-10 MED ORDER — ZOLPIDEM TARTRATE 5 MG PO TABS
5.0000 mg | ORAL_TABLET | Freq: Every evening | ORAL | Status: DC | PRN
  Administered 2015-03-11: 5 mg via ORAL
  Filled 2015-03-10: qty 1

## 2015-03-10 MED ORDER — SODIUM CHLORIDE 0.9 % IV BOLUS (SEPSIS)
1000.0000 mL | Freq: Once | INTRAVENOUS | Status: AC
  Administered 2015-03-10: 1000 mL via INTRAVENOUS

## 2015-03-10 MED ORDER — CALCIUM CARBONATE ANTACID 500 MG PO CHEW: 2.0000 | CHEWABLE_TABLET | ORAL | Status: DC | PRN

## 2015-03-10 NOTE — MAU Note (Signed)
Pt reports she was sent over from MD office for elevated B/P. C/O headache as well.

## 2015-03-10 NOTE — Progress Notes (Addendum)
Patient ID: Brandy Hickman, female   DOB: 02-24-1991, 24 y.o.   MRN: [id number]  WORK IN APPOINTMENT   High Risk Pregnancy Diagnosis(es):   Hx of premature birth x4 and elevated BP  G5P0403 5364w2d Estimated Date of Delivery: 04/26/15    HPI: history by patient who is erratic historian.  The patient is being seen today for ongoing management of high risk pregnancy due to hx of recurrent Preterm deliveries, [facility], has never kept appt for initial prenatal labs. Today she reports she had elevated BP this morning, taken at home, and at triage pt's BP is 140/102, during exam BP is 150/96, thereafter BP is checked again and is 150/100, then 150/98. Marland Kitchen. Pt reports she is on daily meds for her BP, however, cannot recall the name at this time. Pt reports taking her BP meds this morning. Pt also complains of severe headache (recent episode onset yesterday), n/v, and intermittent lightheadedness when going to a standing position from a sitting position. Pt further notes that she did not get any medical attention prior to her last visit to this clinic (at 30 weeks).   Patient reports good fetal movement, denies any bleeding and no rupture of membranes symptoms or regular contractions. Baby "balling up" No ;bleeding or ROM BP weight and urine results all reviewed and noted. Blood pressure 140/102, pulse 88, weight 131 lb (59.421 kg).   [place] [phone] Fetal Surveillance Testing today:  None Fundal Height:  30cm Fetal Heart rate:  153 Edema:  None Cervix: Visually long and closed  Urinalysis: Negative  Questions were answered.  Lab and sonogram results have been reviewed. Comments: not done-- Prenatal labs not ordered yet.   Assessment:  1.  Pregnancy at 4564w2d,  Estimated Date of Delivery: 04/26/15 :  Late to [facility]nc                        2.  HTN  Chronic vs Pre-E vs anxiety vs ?'; send MAU for PIH  workup    3. Suspected anxiety disorder   4.  Hx of preterm delivery    5. Hx of SIDS                         eval made more difficult by pt historian skills, and pt anxiety Medication(s) Plans:  No changes  Treatment Plan:  Obtain records for BP meds                       [facility] further testing, initial labs, PIH w/u , UDS Follow up in 1 weeks for appointment for high risk OB care if released [facility]  By signing my name below, I, Marica OtterNusrat Rahman, attest that this documentation has been prepared under the direction and in the presence of Christin BachJohn Phillip Sandler, MD. Electronically Signed: Marica OtterNusrat Rahman, ED Scribe. 03/10/2015. 12:09 PM.   I personally performed the services described in this documentation, which was SCRIBED in my presence. The recorded information has been reviewed and considered accurate. It has been edited as necessary during review. Tilda BurrowFERGUSON,Iliany Losier V, MD Total time of visit was Over 45 mins in coordination of care, greater than 50% of time such ascalling pharmacy, confirming records, reviewing records from [place], etc.

## 2015-03-10 NOTE — Progress Notes (Signed)
CRITICAL VALUE ALERT  Critical value received:  K+ 2.7   Date of notification:  03/10/15  Time of notification:  2027  Critical value read back:Yes.    Nurse who received alert:Kathy Andrey CampanileWilson   MD notified (1st page):  W.Karim,CNM  Time of first page:  2027  MD notified (2nd page):N/A    Time of second page:N/A  Responding MD:  W.Karim,CNM  Time MD responded:  2027

## 2015-03-10 NOTE — MAU Provider Note (Signed)
History   [id number]   Chief Complaint  Patient presents with  . Hypertension    HPI Brandy Hickman is a 24 y.o. female  [phone] at 6966w2d IUP sent over from office for elevated blood pressures.  In the office patient blood pressures reported as 140/102.  Pt reported to Dr. Emelda FearFerguson that blood pressures ranged from 140-150's/90-100's at home.  Pt reports "pressure at the top of my head", denies vision changes, or epigastric pain.  Denies vaginal bleeding, leaking of fluid.  +occasional contractions.  +fetal movement.  Pt also reports nausea and vomiting in past 24 hours.  Reports vomiting 3x.  Denies fever, body aches, or chills.    Pt has received insufficient prenatal care this pregnancy.  Pregnancy is complicated by late prenatal care, history of preterm delivery x 4 (33-36 weeks), ? Chronic hypertension versus gestational hypertension.     No LMP recorded (lmp unknown). Patient is pregnant.  OB History  Gravida Para Term Preterm AB SAB TAB Ectopic Multiple Living  5 4 0 4 0 0 0 0 0 3     # Outcome Date GA Lbr Len/2nd Weight Sex Delivery Anes PTL Lv  5 Current           4 Preterm 11/06/13 2220w5d  2.325 kg (5 lb 2 oz) F Vag-Spont   ND  3 Preterm 12/25/11 7523w0d  2.608 kg (5 lb 12 oz) F Vag-Spont   Y     Complications: Hypertension  2 Preterm 05/08/10 10863w3d  2.835 kg (6 lb 4 oz) M Vag-Spont   Y  1 Preterm 09/27/07 1123w0d  3.062 kg (6 lb 12 oz) M Vag-Spont   Y     Complications: Preeclampsia      Past Medical History  Diagnosis Date  . Manic-depressive disorder (HCC)     Family History  Problem Relation Age of Onset  . Clotting disorder Mother   . Spina bifida Sister   . Other Brother     Born at 26 weeks has a flute shunt in his brain  . Depression Brother   . Heart failure Maternal Grandmother   . Varicose Veins Maternal Grandmother   . Stroke Maternal Grandfather     Social History   Social History  . Marital Status: Single    Spouse Name: N/A  . Number of  Children: N/A  . Years of Education: N/A   Social History Main Topics  . Smoking status: Current Every Day Smoker -- 0.25 packs/day for 13 years    Types: Cigarettes  . Smokeless tobacco: Never Used  . Alcohol Use: No  . Drug Use: No     Comment: Smoked pot before she found out she was pregnant  . Sexual Activity: Yes    Birth Control/ Protection: None   Other Topics Concern  . None   Social History Narrative   ** Merged History Encounter **        Allergies  Allergen Reactions  . Flexeril [Cyclobenzaprine] Swelling    No current facility-administered medications on file prior to encounter.   Current Outpatient Prescriptions on File Prior to Encounter  Medication Sig Dispense Refill  . citalopram (CELEXA) 40 MG tablet Take 40 mg by mouth daily.    . Prenatal Vit-Fe Fumarate-FA (PRENATAL MULTIVITAMIN) TABS Take 1 tablet by mouth every morning.       Review of Systems  Constitutional: Negative for fever and chills.  Eyes: Negative for visual disturbance.  Gastrointestinal: Positive for nausea and vomiting. Negative for abdominal  pain and diarrhea.  Neurological: Positive for headaches. Negative for light-headedness.  All other systems reviewed and are negative.    Physical Exam   Filed Vitals:   03/10/15 1911 03/10/15 1916 03/10/15 1931  BP: 159/97 125/72 153/89  Pulse: 88 88 82  Temp: 98 F (36.7 C)    Resp: 18      Physical Exam  Constitutional: She is oriented to person, place, and time. She appears well-developed and well-nourished. No distress.  HENT:  Head: Normocephalic.  Eyes: Pupils are equal, round, and reactive to light.  Neck: Normal range of motion. Neck supple.  Cardiovascular: Normal rate and regular rhythm.   Respiratory: Effort normal and breath sounds normal.  GI: Soft. There is no tenderness.  Genitourinary: No bleeding in the vagina. Vaginal discharge (mucusy) found.  Musculoskeletal: Normal range of motion. She exhibits edema (1+  bilat pedal edema ).  Trace pedal edema  Neurological: She is alert and oriented to person, place, and time. She has normal reflexes. She displays normal reflexes.  Skin: Skin is warm and dry.    MAU Course  Procedures Results for orders placed or performed during the hospital encounter of 03/10/15 (from the past 24 hour(s))  Urinalysis, Routine w reflex microscopic (not at [facility])     Status: Abnormal   Collection Time: 03/10/15  6:50 PM  Result Value Ref Range   Color, Urine YELLOW YELLOW   APPearance CLEAR CLEAR   Specific Gravity, Urine 1.015 1.005 - 1.030   pH 6.5 5.0 - 8.0   Glucose, UA NEGATIVE NEGATIVE mg/dL   Hgb urine dipstick NEGATIVE NEGATIVE   Bilirubin Urine NEGATIVE NEGATIVE   Ketones, ur NEGATIVE NEGATIVE mg/dL   Protein, ur NEGATIVE NEGATIVE mg/dL   Urobilinogen, UA 0.2 0.0 - 1.0 mg/dL   Nitrite NEGATIVE NEGATIVE   Leukocytes, UA SMALL (A) NEGATIVE  Protein / creatinine ratio, urine     Status: Abnormal   Collection Time: 03/10/15  6:50 PM  Result Value Ref Range   Creatinine, Urine 77.00 mg/dL   Total Protein, Urine 12 mg/dL   Protein Creatinine Ratio 0.16 (H) 0.00 - 0.15 mg/mg[Cre]  Urine microscopic-add on     Status: Abnormal   Collection Time: 03/10/15  6:50 PM  Result Value Ref Range   Squamous Epithelial / LPF FEW (A) RARE   WBC, UA 3-6 <3 WBC/hpf   RBC / HPF 3-6 <3 RBC/hpf   Bacteria, UA FEW (A) RARE  CBC     Status: Abnormal   Collection Time: 03/10/15  7:45 PM  Result Value Ref Range   WBC 8.8 4.0 - 10.5 K/uL   RBC 3.10 (L) 3.87 - 5.11 MIL/uL   Hemoglobin 9.8 (L) 12.0 - 15.0 g/dL   HCT 16.1 (L) 09.6 - 04.5 %   MCV 90.0 78.0 - 100.0 fL   MCH 31.6 26.0 - 34.0 pg   MCHC 35.1 30.0 - 36.0 g/dL   RDW 40.9 81.1 - 91.4 %   Platelets 214 150 - 400 K/uL  Comprehensive metabolic panel     Status: Abnormal   Collection Time: 03/10/15  7:45 PM  Result Value Ref Range   Sodium 138 135 - 145 mmol/L   Potassium 2.7 (LL) 3.5 - 5.1 mmol/L   Chloride 107  101 - 111 mmol/L   CO2 24 22 - 32 mmol/L   Glucose, Bld 112 (H) 65 - 99 mg/dL   BUN <5 (L) 6 - 20 mg/dL   Creatinine, Ser 7.82 (L) 0.44 -  1.00 mg/dL   Calcium 8.7 (L) 8.9 - 10.3 mg/dL   Total Protein 6.0 (L) 6.5 - 8.1 g/dL   Albumin 3.0 (L) 3.5 - 5.0 g/dL   AST 15 15 - 41 U/L   ALT 11 (L) 14 - 54 U/L   Alkaline Phosphatase 66 38 - 126 U/L   Total Bilirubin 0.4 0.3 - 1.2 mg/dL   GFR calc non Af Amer >60 >60 mL/min   GFR calc Af Amer >60 >60 mL/min   Anion gap 7 5 - 15   2100 Report given to N. Wouk who will discuss with team for plan of care.    Eino Farber Kennith Gain, CNM   Assessment and Plan  24 y.o. [id number] at [redacted]w[redacted]d IUP

## 2015-03-10 NOTE — Progress Notes (Signed)
Pt worked in today for elevated BP. Pt states that she can not keep anything down.

## 2015-03-10 NOTE — H&P (Signed)
HPI Brandy Hickman is a 24 y.o. female [phone] at [redacted]w[redacted]d IUP sent over from office for elevated blood pressures. In the office patient blood pressures reported as 140/102. Pt reported to Dr. Emelda Fear that blood pressures ranged from 140-150's/90-100's at home. Pt reports "pressure at the top of my head", denies vision changes, or epigastric pain. Denies vaginal bleeding, leaking of fluid. +occasional contractions. +fetal movement. Pt also reports nausea and vomiting in past 24 hours. Reports vomiting 3x. Denies fever, body aches, or chills.   Pt has received insufficient prenatal care this pregnancy. Pregnancy is complicated by late prenatal care, history of preterm delivery x 4 (33-36 weeks), ? Chronic hypertension versus gestational hypertension.    No LMP recorded (lmp unknown). Patient is pregnant.  OB History  Gravida Para Term Preterm AB SAB TAB Ectopic Multiple Living     # Outcome Date GA Lbr Len/2nd Weight Sex Delivery Anes PTL Lv  5 Current           4 Preterm 11/06/13 [redacted]w[redacted]d  2.325 kg (5 lb 2 oz) F Vag-Spont   ND  3 Preterm 12/25/11 [redacted]w[redacted]d  2.608 kg (5 lb 12 oz) F Vag-Spont   Y   Complications: Hypertension  2 Preterm 05/08/10 [redacted]w[redacted]d  2.835 kg (6 lb 4 oz) M Vag-Spont   Y  1 Preterm 09/27/07 [redacted]w[redacted]d  3.062 kg (6 lb 12 oz) M Vag-Spont   Y   Complications: Preeclampsia      Past Medical History  Diagnosis Date  . Manic-depressive disorder (HCC)     Family History  Problem Relation Age of Onset  . Clotting disorder Mother   . Spina bifida Sister   . Other Brother     Born at 26 weeks has a flute shunt in his brain  . Depression Brother   . Heart failure Maternal Grandmother   . Varicose Veins Maternal Grandmother   . Stroke Maternal Grandfather     Social History   Social History  . Marital  Status: Single    Spouse Name: N/A  . Number of Children: N/A  . Years of Education: N/A   Social History Main Topics  . Smoking status: Current Every Day Smoker -- 0.25 packs/day for 13 years    Types: Cigarettes  . Smokeless tobacco: Never Used  . Alcohol Use: No  . Drug Use: No     Comment: Smoked pot before she found out she was pregnant  . Sexual Activity: Yes    Birth Control/ Protection: None   Other Topics Concern  . None   Social History Narrative   ** Merged History Encounter **       Allergies  Allergen Reactions  . Flexeril [Cyclobenzaprine] Swelling    No current facility-administered medications on file prior to encounter.   Current Outpatient Prescriptions on File Prior to Encounter  Medication Sig Dispense Refill  . citalopram (CELEXA) 40 MG tablet Take 40 mg by mouth daily.    . Prenatal Vit-Fe Fumarate-FA (PRENATAL MULTIVITAMIN) TABS Take 1 tablet by mouth every morning.       Review of Systems  Constitutional: Negative for fever and chills.  Eyes: Negative for visual disturbance.  Gastrointestinal: Positive for nausea and vomiting. Negative for abdominal pain and diarrhea.  Neurological: Positive for headaches. Negative for light-headedness.  All other systems reviewed and are negative.    Physical Exam   Filed Vitals:   03/10/15 1911 03/10/15 1916 03/10/15 1931  BP: 159/97 125/72 153/89  Pulse: 88 88 82  Temp: 98 F (36.7 C)    Resp: 18      Physical Exam  Constitutional: She is oriented to person, place, and time. She appears well-developed and well-nourished. No distress.  HENT:  Head: Normocephalic.  Eyes: Pupils are equal, round, and reactive to light.  Neck: Normal range of motion. Neck supple.  Cardiovascular: Normal rate and regular rhythm.  Respiratory: Effort normal and breath sounds normal.  GI: Soft. There is no  tenderness.  Genitourinary: No bleeding in the vagina. Vaginal discharge (mucusy) found.  Musculoskeletal: Normal range of motion. She exhibits edema (1+ bilat pedal edema ).  Trace pedal edema  Neurological: She is alert and oriented to person, place, and time. She has normal reflexes. She displays normal reflexes.  Skin: Skin is warm and dry.    MAU Course  Procedures  Lab Results Last 24 Hours    Results for orders placed or performed during the hospital encounter of 03/10/15 (from the past 24 hour(s))  Urinalysis, Routine w reflex microscopic (not at [facility]) Status: Abnormal   Collection Time: 03/10/15 6:50 PM  Result Value Ref Range   Color, Urine YELLOW YELLOW   APPearance CLEAR CLEAR   Specific Gravity, Urine 1.015 1.005 - 1.030   pH 6.5 5.0 - 8.0   Glucose, UA NEGATIVE NEGATIVE mg/dL   Hgb urine dipstick NEGATIVE NEGATIVE   Bilirubin Urine NEGATIVE NEGATIVE   Ketones, ur NEGATIVE NEGATIVE mg/dL   Protein, ur NEGATIVE NEGATIVE mg/dL   Urobilinogen, UA 0.2 0.0 - 1.0 mg/dL   Nitrite NEGATIVE NEGATIVE   Leukocytes, UA SMALL (A) NEGATIVE  Protein / creatinine ratio, urine Status: Abnormal   Collection Time: 03/10/15 6:50 PM  Result Value Ref Range   Creatinine, Urine 77.00 mg/dL   Total Protein, Urine 12 mg/dL   Protein Creatinine Ratio 0.16 (H) 0.00 - 0.15 mg/mg[Cre]  Urine microscopic-add on Status: Abnormal   Collection Time: 03/10/15 6:50 PM  Result Value Ref Range   Squamous Epithelial / LPF FEW (A) RARE   WBC, UA 3-6 <3 WBC/hpf   RBC / HPF 3-6 <3 RBC/hpf   Bacteria, UA FEW (A) RARE  CBC Status: Abnormal   Collection Time: 03/10/15 7:45 PM  Result Value Ref Range   WBC 8.8 4.0 - 10.5 K/uL   RBC 3.10 (L) 3.87 - 5.11 MIL/uL   Hemoglobin 9.8 (L) 12.0 - 15.0 g/dL   HCT 40.927.9 (L) 81.136.0 - 91.446.0 %   MCV 90.0 78.0 - 100.0 fL   MCH  31.6 26.0 - 34.0 pg   MCHC 35.1 30.0 - 36.0 g/dL   RDW 78.212.9 95.611.5 - 21.315.5 %   Platelets 214 150 - 400 K/uL  Comprehensive metabolic panel Status: Abnormal   Collection Time: 03/10/15 7:45 PM  Result Value Ref Range   Sodium 138 135 - 145 mmol/L   Potassium 2.7 (LL) 3.5 - 5.1 mmol/L   Chloride 107 101 - 111 mmol/L   CO2 24 22 - 32 mmol/L   Glucose, Bld 112 (H) 65 - 99 mg/dL   BUN <5 (L) 6 - 20 mg/dL   Creatinine, Ser 0.860.40 (L) 0.44 - 1.00 mg/dL   Calcium 8.7 (L) 8.9 - 10.3 mg/dL   Total Protein 6.0 (L) 6.5 - 8.1 g/dL   Albumin 3.0 (L) 3.5 - 5.0 g/dL   AST 15 15 - 41 U/L   ALT 11 (L) 14 - 54 U/L   Alkaline Phosphatase 66 38 -  126 U/L   Total Bilirubin 0.4 0.3 - 1.2 mg/dL   GFR calc non Af Amer >60 >60 mL/min   GFR calc Af Amer >60 >60 mL/min   Anion gap 7 5 - 15     2100 Report given to N. Wouk who will discuss with team for plan of care.   Eino Farber Kennith Gain, CNM   Assessment and Plan  24 y.o. [id number] at [redacted]w[redacted]d IUP  gHTN w/o significant proteinuria Hypokalemia secondary to previous vomiting Hx PTD x 4  Will admit to Antenatal 24hr urine to be collected LR w/ 40 mEq KCl + four runs KCl Will hold off on BMZ presently  Cam Hai 03/10/2015 10:36 PM

## 2015-03-11 LAB — PMP SCREEN PROFILE (10S), URINE
AMPHETAMINE SCRN UR: NEGATIVE ng/mL
Barbiturate Screen, Ur: NEGATIVE ng/mL
Benzodiazepine Screen, Urine: NEGATIVE ng/mL
CANNABINOIDS UR QL SCN: POSITIVE ng/mL
CREATININE(CRT), U: 83.3 mg/dL (ref 20.0–300.0)
Cocaine(Metab.)Screen, Urine: NEGATIVE ng/mL
Methadone Scn, Ur: NEGATIVE ng/mL
Opiate Scrn, Ur: POSITIVE ng/mL
Oxycodone+Oxymorphone Ur Ql Scn: NEGATIVE ng/mL
PCP SCRN UR: NEGATIVE ng/mL
PH UR, DRUG SCRN: 6.7 (ref 4.5–8.9)
Propoxyphene, Screen: NEGATIVE ng/mL

## 2015-03-11 LAB — PROTEIN, URINE, 24 HOUR
COLLECTION INTERVAL-UPROT: 24 h
Protein, 24H Urine: 186 mg/d — ABNORMAL HIGH (ref 50–100)
Protein, Urine: 6 mg/dL
URINE TOTAL VOLUME-UPROT: 3100 mL

## 2015-03-11 LAB — ABO/RH: ABO/RH(D): A POS

## 2015-03-11 MED ORDER — PANTOPRAZOLE SODIUM 40 MG IV SOLR
40.0000 mg | Freq: Once | INTRAVENOUS | Status: AC
  Administered 2015-03-11: 40 mg via INTRAVENOUS
  Filled 2015-03-11: qty 40

## 2015-03-11 MED ORDER — ONDANSETRON HCL 4 MG/2ML IJ SOLN
4.0000 mg | Freq: Four times a day (QID) | INTRAMUSCULAR | Status: DC | PRN
  Administered 2015-03-11 (×2): 4 mg via INTRAVENOUS
  Filled 2015-03-11 (×2): qty 2

## 2015-03-11 MED ORDER — ONDANSETRON HCL 4 MG/2ML IJ SOLN: 4.0000 mg | Freq: Four times a day (QID) | INTRAMUSCULAR | Status: DC

## 2015-03-11 MED ORDER — CITALOPRAM HYDROBROMIDE 40 MG PO TABS
40.0000 mg | ORAL_TABLET | Freq: Every day | ORAL | Status: DC
  Administered 2015-03-11: 40 mg via ORAL
  Filled 2015-03-11 (×2): qty 1

## 2015-03-11 MED ORDER — LACTATED RINGERS IV BOLUS (SEPSIS)
500.0000 mL | Freq: Once | INTRAVENOUS | Status: AC
  Administered 2015-03-11: 500 mL via INTRAVENOUS

## 2015-03-11 MED ORDER — LABETALOL HCL 5 MG/ML IV SOLN: 20.0000 mg | INTRAVENOUS | Status: DC | PRN

## 2015-03-11 MED ORDER — HYDRALAZINE HCL 20 MG/ML IJ SOLN: 10.0000 mg | Freq: Once | INTRAMUSCULAR | Status: DC | PRN

## 2015-03-11 NOTE — Progress Notes (Signed)
Pt c/o lower back and headache pain. Pt medicated with Ambien and Fioricet per orders (see MAR for admin time). Pt reports emetic episode and states that she saw whole, undigested Ambien and Fioricet pills in vomit. Pt states that she flushed the emesis. No evidence of emesis noted and no pills seen by RN. Comfort measures provided including allowing pt to ambulate in room, Kpad, egg crate mattress applied, and pt advised to take a warm shower. Pt requesting IV pain med or something help with sleep. CNM to be notified of pt's request and comfort measures.

## 2015-03-11 NOTE — Progress Notes (Signed)
RN called CNM to discuss pt's complaints, assessment, and interventions. RN to provide comfort measures and assess effectiveness of measures. If discomfort does not resolve, RN to call Dr. Shawnie PonsPratt for further orders.

## 2015-03-11 NOTE — Progress Notes (Signed)
Patient ID: Brandy Hickman, female   DOB: 12-23-1990, 24 y.o.   MRN: [id number] SUBJECTIVE: 24 yo G5P0403 at 945w3d admitted for evaluation of GHTN; hx late PTB x4 CTSP for lower and mid intermittent abdominal pains radiating to back. Similar to sharp pains she's been having periodically, but more intense. Pain associated with vomiting, position changes and FM. Believes she vomited her Fiorocet given at 1630 for H/A. Marland Kitchen. Had Zofran 4 mg  IV after the N/V episode. She also has substernal and epigastric pain and pressure feeling like she can't exhale fully. Has had heartburn but no meds for that. Headache has been continuous but waxes and wanes. Declines Tylenol. Tearful talking of SIDS loss last year.  OBJECTIVE: Afebrile. Pulse 89. BPs 119-138/72-98, most recent 124/86 SpO2 100% Gen: NAD when distracted, winces and splints abdomen in apparent pain Chest: TTP left and right sternum, not localized Lungs CTA bilat Abd: gravid, soft, some epigastric tenderness Back: neg CVAT or paraspinous TTP SVE: posterior; long; closed; -2 cephalic  IV at 125cc/h  EFM: FHR 120's, reactive; no decelerations Toco: no definite UCs, but irregular squared off spikes c/w voluntary abdominal tightening  ASSESSMENT: GHTN parameters stable RLP, reflux contributing to discomfort N/V  PLAN: Reassured. Discussed RLP relief Try Protonix IV x1 and IVF bolus 500 cc. If no symptomatic relief after 2 hrs, try Fiorocet. Zofran as ordered  Danae Orleanseirdre C Cathren Sween, CNM 03/11/2015 7:14 PM

## 2015-03-11 NOTE — Progress Notes (Signed)
Patient ID: Brandy MayaLendi Latimore, female   DOB: 1990/09/05, 24 y.o.   MRN: [id number] FACULTY PRACTICE ANTEPARTUM(COMPREHENSIVE) NOTE  Brandy Hickman is a 24 y.o. [id number] with Estimated Date of Delivery: 04/26/15   By  midtrimester ultrasound 8946w3d  who is admitted for evaluation for hypertensive disorder in pregnancy.    Fetal presentation is unsure. Length of Stay:  1  Days  Date of admission:03/10/2015  Subjective: No headaches or blurry vision or RUQ pain Patient reports the fetal movement as active. Patient reports uterine contraction  activity as none. Patient reports  vaginal bleeding as none. Patient describes fluid per vagina as None.  Vitals:  Blood pressure 121/53, pulse 73, temperature 98.5 F (36.9 C), temperature source Oral, resp. rate 18, height 5\' 2"  (1.575 m), weight 133 lb (60.328 kg). Filed Vitals:   03/10/15 2146 03/10/15 2201 03/10/15 2303 03/11/15 0625  BP: 139/72 105/58 131/60 121/53  Pulse: 77 82 62 73  Temp:   98.2 F (36.8 C) 98.5 F (36.9 C)  TempSrc:   Oral Oral  Resp:   18 18  Height:   5\' 2"  (1.575 m)   Weight:   133 lb (60.328 kg)    Physical Examination:  General appearance - alert, well appearing, and in no distress Fundal Height:  size equals dates  Extremities: extremities normal, atraumatic, no cyanosis or edema with DTRs 2+ bilaterally Membranes:intact  Fetal Monitoring:     reactive  Labs:  Results for orders placed or performed during the hospital encounter of 03/10/15 (from the past 24 hour(s))  Urinalysis, Routine w reflex microscopic ([facility])   Collection Time: 03/10/15  6:50 PM  Result Value Ref Range   Color, Urine YELLOW YELLOW   APPearance CLEAR CLEAR   Specific Gravity, Urine 1.015 1.005 - 1.030   pH 6.5 5.0 - 8.0   Glucose, UA NEGATIVE NEGATIVE mg/dL   Hgb urine dipstick NEGATIVE NEGATIVE   Bilirubin Urine NEGATIVE NEGATIVE   Ketones, ur NEGATIVE NEGATIVE mg/dL   Protein, ur NEGATIVE NEGATIVE mg/dL   Urobilinogen,  UA 0.2 0.0 - 1.0 mg/dL   Nitrite NEGATIVE NEGATIVE   Leukocytes, UA SMALL (A) NEGATIVE  Protein / creatinine ratio, urine   Collection Time: 03/10/15  6:50 PM  Result Value Ref Range   Creatinine, Urine 77.00 mg/dL   Total Protein, Urine 12 mg/dL   Protein Creatinine Ratio 0.16 (H) 0.00 - 0.15 mg/mg[Cre]  Urine microscopic-add on   Collection Time: 03/10/15  6:50 PM  Result Value Ref Range   Squamous Epithelial / LPF FEW (A) RARE   WBC, UA 3-6 <3 WBC/hpf   RBC / HPF 3-6 <3 RBC/hpf   Bacteria, UA FEW (A) RARE  Urine rapid drug screen (hosp performed)   Collection Time: 03/10/15  6:50 PM  Result Value Ref Range   Opiates POSITIVE (A) NONE DETECTED   Cocaine NONE DETECTED NONE DETECTED   Benzodiazepines NONE DETECTED NONE DETECTED   Amphetamines NONE DETECTED NONE DETECTED   Tetrahydrocannabinol POSITIVE (A) NONE DETECTED   Barbiturates NONE DETECTED NONE DETECTED  CBC   Collection Time: 03/10/15  7:45 PM  Result Value Ref Range   WBC 8.8 4.0 - 10.5 K/uL   RBC 3.10 (L) 3.87 - 5.11 MIL/uL   Hemoglobin 9.8 (L) 12.0 - 15.0 g/dL   HCT 56.227.9 (L) 13.036.0 - 86.546.0 %   MCV 90.0 78.0 - 100.0 fL   MCH 31.6 26.0 - 34.0 pg   MCHC 35.1 30.0 - 36.0 g/dL  RDW 12.9 11.5 - 15.5 %   Platelets 214 150 - 400 K/uL  Comprehensive metabolic panel   Collection Time: 03/10/15  7:45 PM  Result Value Ref Range   Sodium 138 135 - 145 mmol/L   Potassium 2.7 (LL) 3.5 - 5.1 mmol/L   Chloride 107 101 - 111 mmol/L   CO2 24 22 - 32 mmol/L   Glucose, Bld 112 (H) 65 - 99 mg/dL   BUN <5 (L) 6 - 20 mg/dL   Creatinine, Ser 8.29 (L) 0.44 - 1.00 mg/dL   Calcium 8.7 (L) 8.9 - 10.3 mg/dL   Total Protein 6.0 (L) 6.5 - 8.1 g/dL   Albumin 3.0 (L) 3.5 - 5.0 g/dL   AST 15 15 - 41 U/L   ALT 11 (L) 14 - 54 U/L   Alkaline Phosphatase 66 38 - 126 U/L   Total Bilirubin 0.4 0.3 - 1.2 mg/dL   GFR calc non Af Amer >60 >60 mL/min   GFR calc Af Amer >60 >60 mL/min   Anion gap 7 5 - 15  Type and screen [facility]  [place]   Collection Time: 03/10/15 10:37 PM  Result Value Ref Range   ABO/RH(D) A POS    Antibody Screen NEG    Sample Expiration 03/13/2015   ABO/Rh   Collection Time: 03/10/15 10:37 PM  Result Value Ref Range   ABO/RH(D) A POS   Results for orders placed or performed in visit on 03/10/15 (from the past 24 hour(s))  POCT urinalysis dipstick   Collection Time: 03/10/15 11:30 AM  Result Value Ref Range   Color, UA     Clarity, UA     Glucose, UA neg    Bilirubin, UA     Ketones, UA neg    Spec Grav, UA     Blood, UA neg    pH, UA     Protein, UA neg    Urobilinogen, UA     Nitrite, UA neg    Leukocytes, UA Negative Negative    Imaging Studies:     Medications:  Scheduled . citalopram  40 mg Oral Daily  . docusate sodium  100 mg Oral Daily  . prenatal multivitamin  1 tablet Oral Q1200   I have reviewed the patient's current medications.  ASSESSMENT: [id number] [redacted]w[redacted]d Estimated Date of Delivery: 04/26/15  Patient Active Problem List   Diagnosis Date Noted  . Hypertension during pregnancy in third trimester 03/10/2015  . Gestational hypertension w/o significant proteinuria in 3rd trimester 03/10/2015  . Late prenatal care in third trimester 02/15/2015  . Supervision of normal pregnancy 02/15/2015  . History of preterm delivery, currently pregnant in third trimester 02/15/2015  . Hx of preeclampsia, prior pregnancy, currently pregnant 02/15/2015    PLAN: BP has settled down, 24 hour urine is pending to be completed tonight If remains stable will discharge in am  Evarose Altland H 03/11/2015,7:23 AM

## 2015-03-12 DIAGNOSIS — O133 Gestational [pregnancy-induced] hypertension without significant proteinuria, third trimester: Principal | ICD-10-CM

## 2015-03-12 LAB — COMPREHENSIVE METABOLIC PANEL
ALBUMIN: 2.4 g/dL — AB (ref 3.5–5.0)
ALT: 8 U/L — AB (ref 14–54)
AST: 11 U/L — AB (ref 15–41)
Alkaline Phosphatase: 54 U/L (ref 38–126)
Anion gap: 5 (ref 5–15)
BILIRUBIN TOTAL: 0.4 mg/dL (ref 0.3–1.2)
CALCIUM: 7.7 mg/dL — AB (ref 8.9–10.3)
CO2: 23 mmol/L (ref 22–32)
CREATININE: 0.34 mg/dL — AB (ref 0.44–1.00)
Chloride: 108 mmol/L (ref 101–111)
GFR calc Af Amer: >60 mL/min (ref >60)
GFR calc non Af Amer: >60 mL/min (ref >60)
GLUCOSE: 83 mg/dL (ref 65–99)
Potassium: 3.5 mmol/L (ref 3.5–5.1)
SODIUM: 136 mmol/L (ref 135–145)
TOTAL PROTEIN: 5.2 g/dL — AB (ref 6.5–8.1)

## 2015-03-12 MED ORDER — PANTOPRAZOLE SODIUM 40 MG PO TBEC
40.0000 mg | DELAYED_RELEASE_TABLET | Freq: Once | ORAL | Status: AC
  Administered 2015-03-12: 40 mg via ORAL
  Filled 2015-03-12: qty 1

## 2015-03-12 NOTE — Discharge Instructions (Signed)
Dental Work and Pregnancy Proper dental care before, during, and after pregnancy is important for you and your baby. Pregnancy hormones can sometimes cause the gums to swell, which makes it easier for food to become trapped between teeth. The health of your teeth and gums can affect your growing baby.  DENTAL CARE RECOMMENDATIONS To help prevent infection and maintain healthy teeth and gums, a thorough oral examination is recommended for all women in the first trimester of pregnancy. Routine cleanings and examinations are recommended throughout pregnancy.  DENTAL CARE CONSIDERATIONS  Tell your dentist if you are pregnant or want to become pregnant.   If you are pregnant, routine X-ray exams should be avoided until after your baby is born. However, they do not need to be avoided if you are trying to become pregnant.   If you need an emergency procedure that includes a dental X-ray exam during pregnancy, very low levels of radiation will be emitted from the X-ray machines. Lead aprons can be used for protection of the chest, abdomen, and thyroid.  Your dentist will help you weigh the risks and benefits of dental procedures during pregnancy. If possible, it is best to have dental procedures (such as cavity fillings and crown repair) during the second trimester of pregnancy or after the baby is born.   If you and your dentist decide to postpone a procedure for any reason, your dentist can suggest treatment to reduce the chance of infection until the procedure is performed.  HOME CARE INSTRUCTIONS   Follow good oral hygiene habits at home.  Brush your teeth twice daily with fluoride toothpaste. Brush thoroughly for at least 2 minutes. Avoid strong flavored toothpastes if you have morning sickness.   Floss at least once daily.   Eat a well-balanced diet low in sugar and carbohydrates.   See your dentist for normal interval oral examinations and cleanings.   If you vomit, rinse your  mouth with water afterward.   Make a dental appointment if you experience oral problems.   Follow up with your dentist as directed.  SEEK DENTAL CARE IF:  Oral symptoms such as pain, bleeding, swelling, or inflammation develop or worsen.   You develop growths or swelling in between teeth.   You develop signs of infection, such as:   A fever of more than 100.5 F (38.1 C).   Chills.   This information is not intended to replace advice given to you by your health care provider. Make sure you discuss any questions you have with your health care provider.   Document Released: 10/03/2009 Document Revised: 02/03/2013 Document Reviewed: 09/16/2012 Elsevier Interactive Patient Education 2016 [employer] Preeclampsia and Eclampsia Preeclampsia is a serious condition that develops only during pregnancy. It is also called toxemia of pregnancy. This condition causes high blood pressure along with other symptoms, such as swelling and headaches. These may develop as the condition gets worse. Preeclampsia may occur 20 weeks or later into your pregnancy.  Diagnosing and treating preeclampsia early is very important. If not treated early, it can cause serious problems for you and your baby. One problem it can lead to is eclampsia, which is a condition that causes muscle jerking or shaking (convulsions) in the mother. Delivering your baby is the best treatment for preeclampsia or eclampsia.  RISK FACTORS The cause of preeclampsia is not known. You may be more likely to develop preeclampsia if you have certain risk factors. These include:   Being pregnant for the first time.  Having preeclampsia  in a past pregnancy.  Having a family history of preeclampsia.  Having high blood pressure.  Being pregnant with twins or triplets.  Being 71 or older.  Being African American.  Having kidney disease or diabetes.  Having medical conditions such as lupus or blood diseases.  Being very  overweight (obese). SIGNS AND SYMPTOMS  The earliest signs of preeclampsia are:  High blood pressure.  Increased protein in your urine. Your health care provider will check for this at every prenatal visit. Other symptoms that can develop include:   Severe headaches.  Sudden weight gain.  Swelling of your hands, face, legs, and feet.  Feeling sick to your stomach (nauseous) and throwing up (vomiting).  Vision problems (blurred or double vision).  Numbness in your face, arms, legs, and feet.  Dizziness.  Slurred speech.  Sensitivity to bright lights.  Abdominal pain. DIAGNOSIS  There are no screening tests for preeclampsia. Your health care provider will ask you about symptoms and check for signs of preeclampsia during your prenatal visits. You may also have tests, including:  Urine testing.  Blood testing.  Checking your baby's heart rate.  Checking the health of your baby and your placenta using images created with sound waves (ultrasound). TREATMENT  You can work out the best treatment approach together with your health care provider. It is very important to keep all prenatal appointments. If you have an increased risk of preeclampsia, you may need more frequent prenatal exams.  Your health care provider may prescribe bed rest.  You may have to eat as little salt as possible.  You may need to take medicine to lower your blood pressure if the condition does not respond to more conservative measures.  You may need to stay in the hospital if your condition is severe. There, treatment will focus on controlling your blood pressure and fluid retention. You may also need to take medicine to prevent seizures.  If the condition gets worse, your baby may need to be delivered early to protect you and the baby. You may have your labor started with medicine (be induced), or you may have a cesarean delivery.  Preeclampsia usually goes away after the baby is born. HOME CARE  INSTRUCTIONS   Only take over-the-counter or prescription medicines as directed by your health care provider.  Lie on your left side while resting. This keeps pressure off your baby.  Elevate your feet while resting.  Get regular exercise. Ask your health care provider what type of exercise is safe for you.  Avoid caffeine and alcohol.  Do not smoke.  Drink 6-8 glasses of water every day.  Eat a balanced diet that is low in salt. Do not add salt to your food.  Avoid stressful situations as much as possible.  Get plenty of rest and sleep.  Keep all prenatal appointments and tests as scheduled. SEEK MEDICAL CARE IF:  You are gaining more weight than expected.  You have any headaches, abdominal pain, or nausea.  You are bruising more than usual.  You feel dizzy or light-headed. SEEK IMMEDIATE MEDICAL CARE IF:   You develop sudden or severe swelling anywhere in your body. This usually happens in the legs.  You gain 5 lb (2.3 kg) or more in a week.  You have a severe headache, dizziness, problems with your vision, or confusion.  You have severe abdominal pain.  You have lasting nausea or vomiting.  You have a seizure.  You have trouble moving any part of your  body.  You develop numbness in your body.  You have trouble speaking.  You have any abnormal bleeding.  You develop a stiff neck.  You pass out. MAKE SURE YOU:   Understand these instructions.  Will watch your condition.  Will get help right away if you are not doing well or get worse.   This information is not intended to replace advice given to you by your health care provider. Make sure you discuss any questions you have with your health care provider.   Document Released: 04/12/2000 Document Revised: 04/20/2013 Document Reviewed: 02/05/2013 Elsevier Interactive Patient Education [employer]

## 2015-03-12 NOTE — Progress Notes (Signed)
Report given and care relinquished to P. Hart RochesterLawson, RNC.

## 2015-03-12 NOTE — Progress Notes (Signed)
Pt reports mild improvement of headache and back pain with comfort measures.

## 2015-03-12 NOTE — Discharge Summary (Signed)
Antenatal Physician Discharge Summary  Patient ID: Brandy Hickman MRN: [id number] DOB/AGE: [birth year][name] 24 y.o.  Admit date: 03/10/2015 Discharge date: 03/12/2015  Admission Diagnoses: Principal Problem:   Gestational hypertension w/o significant proteinuria in 3rd trimester Active Problems:   Late prenatal care in third trimester   History of preterm delivery, currently pregnant in third trimester   Hx of preeclampsia, prior pregnancy, currently pregnant  Discharge Diagnoses: Same  Prenatal Procedures: NST  Consults: None  Hospital Course:  This is a 24 y.o. [id number] with IUP at 7341w4d admitted for elevated BP in the office.Marland Kitchen. She was admitted and BP were followed. Her highest BP was 130/92. Labs were normal. 24 hour urine was 182 mg. She did have an episode of epigastric pain, which improved after GI cocktail. She was observed, fetal heart rate monitoring remained reassuring, and she had no signs/symptoms of progressing preterm labor or other pre-eclampsia with severe features or maternal-fetal concerns.  Her cervical exam was unchanged from admission.  She was deemed stable for discharge to home with outpatient follow up.  Discharge Exam: Temp:  [98 F (36.7 C)-99 F (37.2 C)] 98 F (36.7 C) (11/13 0649) Pulse Rate:  [43-89] 67 (11/13 0649) Resp:  [15-18] 15 (11/13 0649) BP: (119-134)/(61-98) 120/71 mmHg (11/13 0649) SpO2:  [86 %-98 %] 96 % (11/12 1828) Physical Examination: CONSTITUTIONAL: Well-developed, well-nourished female in no acute distress.  HENT:  Normocephalic, atraumatic, External right and left ear normal. Oropharynx is clear and moist EYES: Conjunctivae and EOM are normal. Pupils are equal, round, and reactive to light. No scleral icterus.  NECK: Normal range of motion, supple, no masses SKIN: Skin is warm and dry. No rash noted. Not diaphoretic. No erythema. No pallor. NEUROLGIC: Alert and oriented to person, place, and time. Normal reflexes, muscle tone  coordination. No cranial nerve deficit noted. PSYCHIATRIC: Normal mood and affect. Normal behavior. Normal judgment and thought content. CARDIOVASCULAR: Normal heart rate noted, regular rhythm RESPIRATORY: Effort and breath sounds normal, no problems with respiration noted MUSCULOSKELETAL: Normal range of motion. No edema and no tenderness. 2+ distal pulses. ABDOMEN: Soft, nontender, nondistended, gravid. CERVIX: Dilation: Fingertip Presentation: Vertex Exam by::  (Diedra CNMW)  Fetal monitoring: FHR: 125 bpm, Variability: moderate, Accelerations: Present, Decelerations: Absent  Uterine activity: irregular contractions   Significant Diagnostic Studies:  Results for orders placed or performed during the hospital encounter of 03/10/15 (from the past 168 hour(s))  Urinalysis, Routine w reflex microscopic (not [facility])   Collection Time: 03/10/15  6:50 PM  Result Value Ref Range   Color, Urine YELLOW YELLOW   APPearance CLEAR CLEAR   Specific Gravity, Urine 1.015 1.005 - 1.030   pH 6.5 5.0 - 8.0   Glucose, UA NEGATIVE NEGATIVE mg/dL   Hgb urine dipstick NEGATIVE NEGATIVE   Bilirubin Urine NEGATIVE NEGATIVE   Ketones, ur NEGATIVE NEGATIVE mg/dL   Protein, ur NEGATIVE NEGATIVE mg/dL   Urobilinogen, UA 0.2 0.0 - 1.0 mg/dL   Nitrite NEGATIVE NEGATIVE   Leukocytes, UA SMALL (A) NEGATIVE  Protein / creatinine ratio, urine   Collection Time: 03/10/15  6:50 PM  Result Value Ref Range   Creatinine, Urine 77.00 mg/dL   Total Protein, Urine 12 mg/dL   Protein Creatinine Ratio 0.16 (H) 0.00 - 0.15 mg/mg[Cre]  Urine microscopic-add on   Collection Time: 03/10/15  6:50 PM  Result Value Ref Range   Squamous Epithelial / LPF FEW (A) RARE   WBC, UA 3-6 <3 WBC/hpf   RBC / HPF 3-6 <3 RBC/hpf  Bacteria, UA FEW (A) RARE  Urine rapid drug screen (hosp performed)   Collection Time: 03/10/15  6:50 PM  Result Value Ref Range   Opiates POSITIVE (A) NONE DETECTED   Cocaine NONE DETECTED NONE  DETECTED   Benzodiazepines NONE DETECTED NONE DETECTED   Amphetamines NONE DETECTED NONE DETECTED   Tetrahydrocannabinol POSITIVE (A) NONE DETECTED   Barbiturates NONE DETECTED NONE DETECTED  CBC   Collection Time: 03/10/15  7:45 PM  Result Value Ref Range   WBC 8.8 4.0 - 10.5 K/uL   RBC 3.10 (L) 3.87 - 5.11 MIL/uL   Hemoglobin 9.8 (L) 12.0 - 15.0 g/dL   HCT 60.4 (L) 54.0 - 98.1 %   MCV 90.0 78.0 - 100.0 fL   MCH 31.6 26.0 - 34.0 pg   MCHC 35.1 30.0 - 36.0 g/dL   RDW 19.1 47.8 - 29.5 %   Platelets 214 150 - 400 K/uL  Comprehensive metabolic panel   Collection Time: 03/10/15  7:45 PM  Result Value Ref Range   Sodium 138 135 - 145 mmol/L   Potassium 2.7 (LL) 3.5 - 5.1 mmol/L   Chloride 107 101 - 111 mmol/L   CO2 24 22 - 32 mmol/L   Glucose, Bld 112 (H) 65 - 99 mg/dL   BUN <5 (L) 6 - 20 mg/dL   Creatinine, Ser 6.21 (L) 0.44 - 1.00 mg/dL   Calcium 8.7 (L) 8.9 - 10.3 mg/dL   Total Protein 6.0 (L) 6.5 - 8.1 g/dL   Albumin 3.0 (L) 3.5 - 5.0 g/dL   AST 15 15 - 41 U/L   ALT 11 (L) 14 - 54 U/L   Alkaline Phosphatase 66 38 - 126 U/L   Total Bilirubin 0.4 0.3 - 1.2 mg/dL   GFR calc non Af Amer >60 >60 mL/min   GFR calc Af Amer >60 >60 mL/min   Anion gap 7 5 - 15  Protein, urine, 24 hour   Collection Time: 03/10/15 10:30 PM  Result Value Ref Range   Urine Total Volume-UPROT 3100 mL   Collection Interval-UPROT 24 hours   Protein, Urine 6 mg/dL   Protein, 30Q Urine 657 (H) 50 - 100 mg/day  Type and screen [facility]   Collection Time: 03/10/15 10:37 PM  Result Value Ref Range   ABO/RH(D) A POS    Antibody Screen NEG    Sample Expiration 03/13/2015   ABO/Rh   Collection Time: 03/10/15 10:37 PM  Result Value Ref Range   ABO/RH(D) A POS   Comprehensive metabolic panel   Collection Time: 03/12/15  5:35 AM  Result Value Ref Range   Sodium 136 135 - 145 mmol/L   Potassium 3.5 3.5 - 5.1 mmol/L   Chloride 108 101 - 111 mmol/L   CO2 23 22 - 32 mmol/L    Glucose, Bld 83 65 - 99 mg/dL   BUN <5 (L) 6 - 20 mg/dL   Creatinine, Ser 8.46 (L) 0.44 - 1.00 mg/dL   Calcium 7.7 (L) 8.9 - 10.3 mg/dL   Total Protein 5.2 (L) 6.5 - 8.1 g/dL   Albumin 2.4 (L) 3.5 - 5.0 g/dL   AST 11 (L) 15 - 41 U/L   ALT 8 (L) 14 - 54 U/L   Alkaline Phosphatase 54 38 - 126 U/L   Total Bilirubin 0.4 0.3 - 1.2 mg/dL   GFR calc non Af Amer >60 >60 mL/min   GFR calc Af Amer >60 >60 mL/min   Anion gap 5 5 -  15  Results for orders placed or performed in visit on 03/10/15 (from the past 168 hour(s))  POCT urinalysis dipstick   Collection Time: 03/10/15 11:30 AM  Result Value Ref Range   Color, UA     Clarity, UA     Glucose, UA neg    Bilirubin, UA     Ketones, UA neg    Spec Grav, UA     Blood, UA neg    pH, UA     Protein, UA neg    Urobilinogen, UA     Nitrite, UA neg    Leukocytes, UA Negative Negative  Results for orders placed or performed in visit on 03/10/15 (from the past 168 hour(s))  Pain Management Screening Profile (10S)   Collection Time: 03/10/15  3:58 PM  Result Value Ref Range   Amphetamine Screen, Ur Negative Cutoff=1000 ng/mL   Barbiturate Screen, Ur Negative Cutoff=200 ng/mL   Benzodiazepine Screen, Urine Negative Cutoff=200 ng/mL   Cannabinoids Ur Ql Scn Positive Cutoff=20 ng/mL   Cocaine(Metab.)Screen, Urine Negative Cutoff=300 ng/mL   Opiate Scrn, Ur Positive Cutoff=300 ng/mL   Oxycodone+Oxymorphone Ur Ql Scn Negative Cutoff=100 ng/mL   PCP Scrn, Ur Negative Cutoff=25 ng/mL   Methadone Scn, Ur Negative Cutoff=300 ng/mL   Propoxyphene, Screen Negative Cutoff=300 ng/mL   Creatinine(Crt), U 83.3 20.0 - 300.0 mg/dL   Ph of Urine 6.7 4.5 - 8.9   PLEASE NOTE: Comment     Discharge Condition: Stable  Disposition: 01-Home or Self Care   Discharge Instructions    Discharge activity:  Bathroom / Shower only    Complete by:  As directed      Discharge activity: Bedrest    Complete by:  As directed      Discharge diet:  No restrictions     Complete by:  As directed      Fetal Kick Count:  Lie on our left side for one hour after a meal, and count the number of times your baby kicks.  If it is less than 5 times, get up, move around and drink some juice.  Repeat the test 30 minutes later.  If it is still less than 5 kicks in an hour, notify your doctor.    Complete by:  As directed      No sexual activity restrictions    Complete by:  As directed      Notify physician for a general feeling that "something is not right"    Complete by:  As directed      Notify physician for increase or change in vaginal discharge    Complete by:  As directed      Notify physician for intestinal cramps, with or without diarrhea, sometimes described as "gas pain"    Complete by:  As directed      Notify physician for leaking of fluid    Complete by:  As directed      Notify physician for low, dull backache, unrelieved by heat or Tylenol    Complete by:  As directed      Notify physician for menstrual like cramps    Complete by:  As directed      Notify physician for pelvic pressure    Complete by:  As directed      Notify physician for uterine contractions.  These may be painless and feel like the uterus is tightening or the baby is  "balling up"    Complete by:  As directed  Notify physician for vaginal bleeding    Complete by:  As directed      PRETERM LABOR:  Includes any of the follwing symptoms that occur between 20 - [redacted] weeks gestation.  If these symptoms are not stopped, preterm labor can result in preterm delivery, placing your baby at risk    Complete by:  As directed             Medication List    TAKE these medications        citalopram 40 MG tablet  Commonly known as:  CELEXA  Take 40 mg by mouth daily.     prenatal multivitamin Tabs tablet  Take 1 tablet by mouth every morning.           Follow-up Information    Follow up with [facility].   Specialty:  Obstetrics and Gynecology   Why:  keep next  scheduled appointment   Contact information:   [address] [place] [address] [phone]      Signed: Reva Bores M.D. 03/12/2015, 7:34 AM

## 2015-03-14 ENCOUNTER — Encounter: Payer: Medicaid Other | Admitting: Obstetrics and Gynecology

## 2015-03-14 ENCOUNTER — Encounter: Payer: Self-pay | Admitting: Obstetrics and Gynecology

## 2015-03-15 ENCOUNTER — Ambulatory Visit (INDEPENDENT_AMBULATORY_CARE_PROVIDER_SITE_OTHER): Payer: Medicaid Other | Admitting: Obstetrics and Gynecology

## 2015-03-15 VITALS — BP 138/80 | HR 84 | Wt 125.0 lb

## 2015-03-15 DIAGNOSIS — Z331 Pregnant state, incidental: Secondary | ICD-10-CM

## 2015-03-15 DIAGNOSIS — O163 Unspecified maternal hypertension, third trimester: Secondary | ICD-10-CM

## 2015-03-15 DIAGNOSIS — Z1389 Encounter for screening for other disorder: Secondary | ICD-10-CM | POA: Diagnosis not present

## 2015-03-15 DIAGNOSIS — O0933 Supervision of pregnancy with insufficient antenatal care, third trimester: Secondary | ICD-10-CM

## 2015-03-15 DIAGNOSIS — O09893 Supervision of other high risk pregnancies, third trimester: Secondary | ICD-10-CM | POA: Diagnosis not present

## 2015-03-15 DIAGNOSIS — O09293 Supervision of pregnancy with other poor reproductive or obstetric history, third trimester: Secondary | ICD-10-CM

## 2015-03-15 LAB — POCT URINALYSIS DIPSTICK
Glucose, UA: NEGATIVE
KETONES UA: NEGATIVE
LEUKOCYTES UA: NEGATIVE
Nitrite, UA: NEGATIVE
RBC UA: NEGATIVE

## 2015-03-15 MED ORDER — PROMETHAZINE HCL 12.5 MG PO TABS: 12.5000 mg | ORAL_TABLET | Freq: Four times a day (QID) | ORAL | Status: DC | PRN

## 2015-03-15 MED ORDER — OXYCODONE-ACETAMINOPHEN 5-325 MG PO TABS: 1.0000 | ORAL_TABLET | Freq: Three times a day (TID) | ORAL | Status: DC | PRN

## 2015-03-15 NOTE — Progress Notes (Signed)
Patient ID: Brandy Hickman, female   DOB: [birth year], 24 y.o.   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  Today she reports intermittent nausea and that she has trouble with her appetite. Pt has h/o drug use, opiates Rx'd allegedly by Dr Danella MaiersHolifield [place] and h/o of marijuana use. She states that marijuana helps with nausea and would like something to use for that so she does not have to smoke marijuana. She has taken Phenergan in the past with relief for the nausea. Pt has been prescribed 7.5 mg oxycodone 20 pills every 30 days by Dr. Maralyn SagoHollifield.  Patient reports good fetal movement, denies any bleeding and no rupture of membranes symptoms or regular contractions.   BP weight and urine results all reviewed and noted. Blood pressure 138/80, pulse 84, weight 125 lb (56.7 kg).  Fetal Surveillance Testing today:  none Fundal Height:  31cm Fetal Heart rate:  121 Edema:  none Baby is vertex Urinalysis: Positive for trace protein   Questions were answered.  Lab and sonogram results have been reviewed. Comments: normal   Assessment:  1.  Pregnancy at 7868w0d,  Estimated Date of Delivery: 04/26/15                         2. h/o premature birth x4 and elevated BP                        3.  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 Samvel Zinn MD Electronically Signed: Jarvis Morganaylor Laura Radilla, ED Scribe. 03/15/2015. 3:33 PM.   I personally performed the services described in this documentation, which was SCRIBED in my presence. The recorded information has been reviewed and considered accurate. It has been edited as necessary during review. Tilda BurrowFERGUSON,Aliviana Burdell V, MD

## 2015-03-15 NOTE — Progress Notes (Signed)
Pt states that she is still having some of the pressure she was the other day, pt states that she is also having some pain in her lower back. Pt states that she has bad nausea and that she has trouble with her appetite.

## 2015-03-30 ENCOUNTER — Encounter: Payer: Medicaid Other | Admitting: Obstetrics and Gynecology

## 2015-03-31 ENCOUNTER — Encounter: Payer: Self-pay | Admitting: Obstetrics and Gynecology

## 2015-03-31 ENCOUNTER — Encounter: Payer: Medicaid Other | Admitting: Obstetrics and Gynecology

## 2015-04-09 LAB — OB RESULTS CONSOLE GBS: GBS: NEGATIVE

## 2015-04-12 ENCOUNTER — Ambulatory Visit (INDEPENDENT_AMBULATORY_CARE_PROVIDER_SITE_OTHER): Payer: Medicaid Other | Admitting: Obstetrics & Gynecology

## 2015-04-12 ENCOUNTER — Encounter: Payer: Self-pay | Admitting: Obstetrics & Gynecology

## 2015-04-12 VITALS — BP 120/70 | HR 72 | Wt 131.0 lb

## 2015-04-12 DIAGNOSIS — Z331 Pregnant state, incidental: Secondary | ICD-10-CM

## 2015-04-12 DIAGNOSIS — Z3493 Encounter for supervision of normal pregnancy, unspecified, third trimester: Secondary | ICD-10-CM

## 2015-04-12 DIAGNOSIS — O163 Unspecified maternal hypertension, third trimester: Secondary | ICD-10-CM | POA: Diagnosis not present

## 2015-04-12 DIAGNOSIS — O0933 Supervision of pregnancy with insufficient antenatal care, third trimester: Secondary | ICD-10-CM

## 2015-04-12 DIAGNOSIS — Z1389 Encounter for screening for other disorder: Secondary | ICD-10-CM | POA: Diagnosis not present

## 2015-04-12 DIAGNOSIS — O09893 Supervision of other high risk pregnancies, third trimester: Secondary | ICD-10-CM

## 2015-04-12 DIAGNOSIS — O09213 Supervision of pregnancy with history of pre-term labor, third trimester: Secondary | ICD-10-CM

## 2015-04-12 DIAGNOSIS — Z3A38 38 weeks gestation of pregnancy: Secondary | ICD-10-CM

## 2015-04-12 LAB — POCT URINALYSIS DIPSTICK
GLUCOSE UA: NEGATIVE
Ketones, UA: NEGATIVE
Leukocytes, UA: NEGATIVE
NITRITE UA: NEGATIVE
Protein, UA: NEGATIVE
RBC UA: NEGATIVE

## 2015-04-12 NOTE — Progress Notes (Signed)
[id number] 6975w0d Estimated Date of Delivery: 04/26/15  Blood pressure 120/70, pulse 72, weight 131 lb (59.421 kg).   BP weight and urine results all reviewed and noted.  Please refer to the obstetrical flow sheet for the fundal height and fetal heart rate documentation:  Patient reports good fetal movement, denies any bleeding and no rupture of membranes symptoms or regular contractions. Patient is without complaints. All questions were answered.  Orders Placed This Encounter  Procedures  . POCT urinalysis dipstick    Plan:  Continued routine obstetrical care, I didn't realize until after I exmined her cervix that she had not had a Group B strep assessment done If comes back in next week will do GBS  Return in about 1 week (around 04/19/2015) for LROB.

## 2015-04-13 ENCOUNTER — Telehealth: Payer: Self-pay | Admitting: Obstetrics & Gynecology

## 2015-04-13 MED ORDER — OXYCODONE-ACETAMINOPHEN 5-325 MG PO TABS: 1.0000 | ORAL_TABLET | Freq: Three times a day (TID) | ORAL | Status: DC | PRN

## 2015-04-14 ENCOUNTER — Telehealth: Payer: Self-pay | Admitting: Obstetrics & Gynecology

## 2015-04-14 NOTE — Telephone Encounter (Signed)
Pt came into the office to pick up her medication and she requested for Dr. Despina HiddenEure to give her a call because she has a question for him since her was the last one to check her. Please contact pt

## 2015-04-16 ENCOUNTER — Inpatient Hospital Stay (HOSPITAL_COMMUNITY)
Admission: AD | Admit: 2015-04-16 | Discharge: 2015-04-16 | Disposition: A | Discharge status: Home / Self Care | Payer: Medicaid Other | Source: Ambulatory Visit | Attending: Obstetrics & Gynecology | Admitting: Obstetrics & Gynecology

## 2015-04-16 ENCOUNTER — Encounter (HOSPITAL_COMMUNITY): Payer: Self-pay | Admitting: *Deleted

## 2015-04-16 DIAGNOSIS — O09213 Supervision of pregnancy with history of pre-term labor, third trimester: Secondary | ICD-10-CM

## 2015-04-16 DIAGNOSIS — O163 Unspecified maternal hypertension, third trimester: Secondary | ICD-10-CM

## 2015-04-16 DIAGNOSIS — Z3493 Encounter for supervision of normal pregnancy, unspecified, third trimester: Secondary | ICD-10-CM | POA: Diagnosis not present

## 2015-04-16 DIAGNOSIS — O0933 Supervision of pregnancy with insufficient antenatal care, third trimester: Secondary | ICD-10-CM

## 2015-04-16 DIAGNOSIS — O09893 Supervision of other high risk pregnancies, third trimester: Secondary | ICD-10-CM

## 2015-04-16 DIAGNOSIS — O09293 Supervision of pregnancy with other poor reproductive or obstetric history, third trimester: Secondary | ICD-10-CM

## 2015-04-16 NOTE — Discharge Instructions (Signed)
Braxton Hicks Contractions °Contractions of the uterus can occur throughout pregnancy. Contractions are not always a sign that you are in labor.  °WHAT ARE BRAXTON HICKS CONTRACTIONS?  °Contractions that occur before labor are called Braxton Hicks contractions, or false labor. Toward the end of pregnancy (32-34 weeks), these contractions can develop more often and may become more forceful. This is not true labor because these contractions do not result in opening (dilatation) and thinning of the cervix. They are sometimes difficult to tell apart from true labor because these contractions can be forceful and people have different pain tolerances. You should not feel embarrassed if you go to the hospital with false labor. Sometimes, the only way to tell if you are in true labor is for your health care provider to look for changes in the cervix. °If there are no prenatal problems or other health problems associated with the pregnancy, it is completely safe to be sent home with false labor and await the onset of true labor. °HOW CAN YOU TELL THE DIFFERENCE BETWEEN TRUE AND FALSE LABOR? °False Labor °· The contractions of false labor are usually shorter and not as hard as those of true labor.   °· The contractions are usually irregular.   °· The contractions are often felt in the front of the lower abdomen and in the groin.   °· The contractions may go away when you walk around or change positions while lying down.   °· The contractions get weaker and are shorter lasting as time goes on.   °· The contractions do not usually become progressively stronger, regular, and closer together as with true labor.   °True Labor °1. Contractions in true labor last 30-70 seconds, become very regular, usually become more intense, and increase in frequency.   °2. The contractions do not go away with walking.   °3. The discomfort is usually felt in the top of the uterus and spreads to the lower abdomen and low back.   °4. True labor can  be determined by your health care provider with an exam. This will show that the cervix is dilating and getting thinner.   °WHAT TO REMEMBER °· Keep up with your usual exercises and follow other instructions given by your health care provider.   °· Take medicines as directed by your health care provider.   °· Keep your regular prenatal appointments.   °· Eat and drink lightly if you think you are going into labor.   °· If Braxton Hicks contractions are making you uncomfortable:   °· Change your position from lying down or resting to walking, or from walking to resting.   °· Sit and rest in a tub of warm water.   °· Drink 2-3 glasses of water. Dehydration may cause these contractions.   °· Do slow and deep breathing several times an hour.   °WHEN SHOULD I SEEK IMMEDIATE MEDICAL CARE? °Seek immediate medical care if: °· Your contractions become stronger, more regular, and closer together.   °· You have fluid leaking or gushing from your vagina.   °· You have a fever.   °· You pass blood-tinged mucus.   °· You have vaginal bleeding.   °· You have continuous abdominal pain.   °· You have low back pain that you never had before.   °· You feel your baby's head pushing down and causing pelvic pressure.   °· Your baby is not moving as much as it used to.   °  °This information is not intended to replace advice given to you by your health care provider. Make sure you discuss any questions you have with your health care   provider. °  °Document Released: 04/15/2005 Document Revised: 04/20/2013 Document Reviewed: 01/25/2013 °Elsevier Interactive Patient Education ©2016 [employer] ° °Fetal Movement Counts °Patient Name: __________________________________________________ Patient Due Date: ____________________ °Performing a fetal movement count is highly recommended in high-risk pregnancies, but it is good for every pregnant woman to do. Your health care provider may ask you to start counting fetal movements at 28 weeks of the  pregnancy. Fetal movements often increase: °· After eating a full meal. °· After physical activity. °· After eating or drinking something sweet or cold. °· At rest. °Pay attention to when you feel the baby is most active. This will help you notice a pattern of your baby's sleep and wake cycles and what factors contribute to an increase in fetal movement. It is important to perform a fetal movement count at the same time each day when your baby is normally most active.  °HOW TO COUNT FETAL MOVEMENTS °5. Find a quiet and comfortable area to sit or lie down on your left side. Lying on your left side provides the best blood and oxygen circulation to your baby. °6. Write down the day and time on a sheet of paper or in a journal. °7. Start counting kicks, flutters, swishes, rolls, or jabs in a 2-hour period. You should feel at least 10 movements within 2 hours. °8. If you do not feel 10 movements in 2 hours, wait 2-3 hours and count again. Look for a change in the pattern or not enough counts in 2 hours. °SEEK MEDICAL CARE IF: °· You feel less than 10 counts in 2 hours, tried twice. °· There is no movement in over an hour. °· The pattern is changing or taking longer each day to reach 10 counts in 2 hours. °· You feel the baby is not moving as he or she usually does. °Date: ____________ Movements: ____________ Start time: ____________ Finish time: ____________  °Date: ____________ Movements: ____________ Start time: ____________ Finish time: ____________ °Date: ____________ Movements: ____________ Start time: ____________ Finish time: ____________ °Date: ____________ Movements: ____________ Start time: ____________ Finish time: ____________ °Date: ____________ Movements: ____________ Start time: ____________ Finish time: ____________ °Date: ____________ Movements: ____________ Start time: ____________ Finish time: ____________ °Date: ____________ Movements: ____________ Start time: ____________ Finish time:  ____________ °Date: ____________ Movements: ____________ Start time: ____________ Finish time: ____________  °Date: ____________ Movements: ____________ Start time: ____________ Finish time: ____________ °Date: ____________ Movements: ____________ Start time: ____________ Finish time: ____________ °Date: ____________ Movements: ____________ Start time: ____________ Finish time: ____________ °Date: ____________ Movements: ____________ Start time: ____________ Finish time: ____________ °Date: ____________ Movements: ____________ Start time: ____________ Finish time: ____________ °Date: ____________ Movements: ____________ Start time: ____________ Finish time: ____________ °Date: ____________ Movements: ____________ Start time: ____________ Finish time: ____________  °Date: ____________ Movements: ____________ Start time: ____________ Finish time: ____________ °Date: ____________ Movements: ____________ Start time: ____________ Finish time: ____________ °Date: ____________ Movements: ____________ Start time: ____________ Finish time: ____________ °Date: ____________ Movements: ____________ Start time: ____________ Finish time: ____________ °Date: ____________ Movements: ____________ Start time: ____________ Finish time: ____________ °Date: ____________ Movements: ____________ Start time: ____________ Finish time: ____________ °Date: ____________ Movements: ____________ Start time: ____________ Finish time: ____________  °Date: ____________ Movements: ____________ Start time: ____________ Finish time: ____________ °Date: ____________ Movements: ____________ Start time: ____________ Finish time: ____________ °Date: ____________ Movements: ____________ Start time: ____________ Finish time: ____________ °Date: ____________ Movements: ____________ Start time: ____________ Finish time: ____________ °Date: ____________ Movements: ____________ Start time: ____________ Finish time: ____________ °Date: ____________ Movements:  ____________ Start time: ____________ Finish   time: ____________ °Date: ____________ Movements: ____________ Start time: ____________ Finish time: ____________  °Date: ____________ Movements: ____________ Start time: ____________ Finish time: ____________ °Date: ____________ Movements: ____________ Start time: ____________ Finish time: ____________ °Date: ____________ Movements: ____________ Start time: ____________ Finish time: ____________ °Date: ____________ Movements: ____________ Start time: ____________ Finish time: ____________ °Date: ____________ Movements: ____________ Start time: ____________ Finish time: ____________ °Date: ____________ Movements: ____________ Start time: ____________ Finish time: ____________ °Date: ____________ Movements: ____________ Start time: ____________ Finish time: ____________  °Date: ____________ Movements: ____________ Start time: ____________ Finish time: ____________ °Date: ____________ Movements: ____________ Start time: ____________ Finish time: ____________ °Date: ____________ Movements: ____________ Start time: ____________ Finish time: ____________ °Date: ____________ Movements: ____________ Start time: ____________ Finish time: ____________ °Date: ____________ Movements: ____________ Start time: ____________ Finish time: ____________ °Date: ____________ Movements: ____________ Start time: ____________ Finish time: ____________ °Date: ____________ Movements: ____________ Start time: ____________ Finish time: ____________  °Date: ____________ Movements: ____________ Start time: ____________ Finish time: ____________ °Date: ____________ Movements: ____________ Start time: ____________ Finish time: ____________ °Date: ____________ Movements: ____________ Start time: ____________ Finish time: ____________ °Date: ____________ Movements: ____________ Start time: ____________ Finish time: ____________ °Date: ____________ Movements: ____________ Start time: ____________ Finish  time: ____________ °Date: ____________ Movements: ____________ Start time: ____________ Finish time: ____________ °Date: ____________ Movements: ____________ Start time: ____________ Finish time: ____________  °Date: ____________ Movements: ____________ Start time: ____________ Finish time: ____________ °Date: ____________ Movements: ____________ Start time: ____________ Finish time: ____________ °Date: ____________ Movements: ____________ Start time: ____________ Finish time: ____________ °Date: ____________ Movements: ____________ Start time: ____________ Finish time: ____________ °Date: ____________ Movements: ____________ Start time: ____________ Finish time: ____________ °Date: ____________ Movements: ____________ Start time: ____________ Finish time: ____________ °  °This information is not intended to replace advice given to you by your health care provider. Make sure you discuss any questions you have with your health care provider. °  °Document Released: 05/15/2006 Document Revised: 05/06/2014 Document Reviewed: 02/10/2012 °Elsevier Interactive Patient Education ©2016 Elsevier Inc. ° °

## 2015-04-16 NOTE — MAU Note (Signed)
Pt presents to MAU for complaints of contractions. Denies any vaginal bleeding or LOF

## 2015-04-17 NOTE — Telephone Encounter (Signed)
Pt states has discussed with Dr. Despina HiddenEure while [facility]

## 2015-04-18 ENCOUNTER — Encounter: Payer: Medicaid Other | Admitting: Obstetrics & Gynecology

## 2015-04-18 ENCOUNTER — Telehealth: Payer: Self-pay | Admitting: Obstetrics & Gynecology

## 2015-04-18 NOTE — Telephone Encounter (Signed)
Pt c/o irregular back contractions, "hurting really bad" decrease FM, a lot of pressure. Pt [place] in [place] and did not want to wait too long before she called. Pt advised to come to our office now for evaluation. Pt states she will need to make arrangements for transportation. Informed pt if she could not get here before our office closed [facility], last appt time is 4 pm. Pt verbalized understanding.

## 2015-04-19 ENCOUNTER — Encounter: Payer: Self-pay | Admitting: Obstetrics and Gynecology

## 2015-04-19 ENCOUNTER — Ambulatory Visit (INDEPENDENT_AMBULATORY_CARE_PROVIDER_SITE_OTHER): Payer: Medicaid Other | Admitting: Obstetrics and Gynecology

## 2015-04-19 ENCOUNTER — Encounter: Payer: Medicaid Other | Admitting: Obstetrics & Gynecology

## 2015-04-19 VITALS — BP 110/80 | HR 76 | Wt 129.2 lb

## 2015-04-19 DIAGNOSIS — O163 Unspecified maternal hypertension, third trimester: Secondary | ICD-10-CM | POA: Diagnosis not present

## 2015-04-19 DIAGNOSIS — Z3A39 39 weeks gestation of pregnancy: Secondary | ICD-10-CM | POA: Diagnosis not present

## 2015-04-19 DIAGNOSIS — Z1389 Encounter for screening for other disorder: Secondary | ICD-10-CM | POA: Diagnosis not present

## 2015-04-19 DIAGNOSIS — Z331 Pregnant state, incidental: Secondary | ICD-10-CM

## 2015-04-19 DIAGNOSIS — O09893 Supervision of other high risk pregnancies, third trimester: Secondary | ICD-10-CM

## 2015-04-19 DIAGNOSIS — Z369 Encounter for antenatal screening, unspecified: Secondary | ICD-10-CM

## 2015-04-19 DIAGNOSIS — O09213 Supervision of pregnancy with history of pre-term labor, third trimester: Secondary | ICD-10-CM

## 2015-04-19 LAB — POCT URINALYSIS DIPSTICK
Glucose, UA: NEGATIVE
KETONES UA: NEGATIVE
Leukocytes, UA: NEGATIVE
Nitrite, UA: NEGATIVE
PROTEIN UA: NEGATIVE
RBC UA: NEGATIVE

## 2015-04-19 NOTE — Progress Notes (Signed)
Patient ID: Brandy Hickman, female   DOB: December 08, 1990, 24 y.o.   MRN: [id number]  [id number] 1470w0d Estimated Date of Delivery: 04/26/15  Blood pressure 110/80, pulse 76, weight 129 lb 3.2 oz (58.605 kg).   refer to the ob flow sheet for FH and FHR, also BP, Wt, Urine results:notable for negative  Patient reports  + good fetal movement, denies any bleeding and no rupture of membranes symptoms or regular contractions. Patient complaints:none.  FHR- 156 bpm  FH- 34 cm   Questions were answered. Assessment: LROB [id number] @ 6070w0d Group B strep obtained   Cervix 2cm; firm; -2   Plan:  Continued routine obstetrical care  F/u in 1 week for routine prenatal care    By signing my name below, I, Doreatha MartinEva Mathews, attest that this documentation has been prepared under the direction and in the presence of Tilda BurrowJohn Verley Pariseau V, MD. Electronically Signed: Doreatha MartinEva Mathews, ED Scribe. 04/19/2015. 12:05 PM.  I personally performed the services described in this documentation, which was SCRIBED in my presence. The recorded information has been reviewed and considered accurate. It has been edited as necessary during review. Tilda BurrowFERGUSON,Srishti Strnad V, MD

## 2015-04-21 LAB — STREP GP B NAA: STREP GROUP B AG: NEGATIVE

## 2015-04-26 ENCOUNTER — Encounter: Payer: Self-pay | Admitting: Obstetrics and Gynecology

## 2015-04-26 ENCOUNTER — Ambulatory Visit (INDEPENDENT_AMBULATORY_CARE_PROVIDER_SITE_OTHER): Payer: Medicaid Other | Admitting: Obstetrics and Gynecology

## 2015-04-26 VITALS — BP 142/70 | HR 84 | Wt 129.0 lb

## 2015-04-26 DIAGNOSIS — Z3493 Encounter for supervision of normal pregnancy, unspecified, third trimester: Secondary | ICD-10-CM

## 2015-04-26 DIAGNOSIS — O163 Unspecified maternal hypertension, third trimester: Secondary | ICD-10-CM

## 2015-04-26 DIAGNOSIS — Z3A40 40 weeks gestation of pregnancy: Secondary | ICD-10-CM | POA: Diagnosis not present

## 2015-04-26 DIAGNOSIS — Z331 Pregnant state, incidental: Secondary | ICD-10-CM | POA: Diagnosis not present

## 2015-04-26 DIAGNOSIS — O09893 Supervision of other high risk pregnancies, third trimester: Secondary | ICD-10-CM

## 2015-04-26 DIAGNOSIS — Z1389 Encounter for screening for other disorder: Secondary | ICD-10-CM

## 2015-04-26 LAB — POCT URINALYSIS DIPSTICK
GLUCOSE UA: NEGATIVE
KETONES UA: NEGATIVE
Leukocytes, UA: NEGATIVE
Nitrite, UA: NEGATIVE
PROTEIN UA: NEGATIVE
RBC UA: NEGATIVE

## 2015-04-26 NOTE — Progress Notes (Signed)
[id number] 6167w0d Estimated Date of Delivery: 04/26/15  Blood pressure 142/70, pulse 84, weight 129 lb (58.514 kg).   refer to the ob flow sheet for FH and FHR, also BP, Wt, Urine results:notable for neg protein  Patient reports   good fetal movement, denies any bleeding and no rupture of membranes symptoms or regular contractions. Patient complaints: requests scheduling of .IOL . [place]. Claims to be fatigued by prolonged pregnancy . Will schedule at 41 wk per protocol  Questions were answered. Assessment: LROB [id number] @ 2067w0d extra time required to counsel re IOL, and circ, and tubal scheduling at 4 wk pp I spent over 25 minutes with the visit with >than 50% spent in counseling and coordination of care.   Plan:  Continued routine obstetrical care, with IOL scheduled for 41 wk at midnight.   F/u in 1 weeks for at midnight on 3 Jan for IOL

## 2015-04-26 NOTE — Patient Instructions (Signed)
INduction scheduled for midnight on the 3 rd Jan, at exactly 41wk.

## 2015-04-28 ENCOUNTER — Inpatient Hospital Stay (HOSPITAL_COMMUNITY): Payer: Medicaid Other | Admitting: Anesthesiology

## 2015-04-28 ENCOUNTER — Encounter (HOSPITAL_COMMUNITY): Payer: Self-pay | Admitting: *Deleted

## 2015-04-28 ENCOUNTER — Inpatient Hospital Stay (HOSPITAL_COMMUNITY)
Admission: AD | Admit: 2015-04-28 | Discharge: 2015-04-30 | DRG: 775 | Disposition: A | Discharge status: Home / Self Care | Payer: Medicaid Other | Source: Ambulatory Visit | Attending: Obstetrics and Gynecology | Admitting: Obstetrics and Gynecology

## 2015-04-28 ENCOUNTER — Telehealth (HOSPITAL_COMMUNITY): Payer: Self-pay | Admitting: *Deleted

## 2015-04-28 DIAGNOSIS — O134 Gestational [pregnancy-induced] hypertension without significant proteinuria, complicating childbirth: Secondary | ICD-10-CM | POA: Diagnosis present

## 2015-04-28 DIAGNOSIS — O0933 Supervision of pregnancy with insufficient antenatal care, third trimester: Secondary | ICD-10-CM

## 2015-04-28 DIAGNOSIS — O99334 Smoking (tobacco) complicating childbirth: Secondary | ICD-10-CM | POA: Diagnosis present

## 2015-04-28 DIAGNOSIS — O4292 Full-term premature rupture of membranes, unspecified as to length of time between rupture and onset of labor: Secondary | ICD-10-CM | POA: Diagnosis present

## 2015-04-28 DIAGNOSIS — Z8249 Family history of ischemic heart disease and other diseases of the circulatory system: Secondary | ICD-10-CM | POA: Diagnosis not present

## 2015-04-28 DIAGNOSIS — Z823 Family history of stroke: Secondary | ICD-10-CM | POA: Diagnosis not present

## 2015-04-28 DIAGNOSIS — O26893 Other specified pregnancy related conditions, third trimester: Secondary | ICD-10-CM

## 2015-04-28 DIAGNOSIS — Z3A40 40 weeks gestation of pregnancy: Secondary | ICD-10-CM

## 2015-04-28 DIAGNOSIS — O09293 Supervision of pregnancy with other poor reproductive or obstetric history, third trimester: Secondary | ICD-10-CM

## 2015-04-28 DIAGNOSIS — M549 Dorsalgia, unspecified: Secondary | ICD-10-CM

## 2015-04-28 DIAGNOSIS — F17210 Nicotine dependence, cigarettes, uncomplicated: Secondary | ICD-10-CM | POA: Diagnosis present

## 2015-04-28 DIAGNOSIS — O09893 Supervision of other high risk pregnancies, third trimester: Secondary | ICD-10-CM

## 2015-04-28 DIAGNOSIS — O09213 Supervision of pregnancy with history of pre-term labor, third trimester: Secondary | ICD-10-CM

## 2015-04-28 DIAGNOSIS — Z3493 Encounter for supervision of normal pregnancy, unspecified, third trimester: Secondary | ICD-10-CM

## 2015-04-28 DIAGNOSIS — O4290 Premature rupture of membranes, unspecified as to length of time between rupture and onset of labor, unspecified weeks of gestation: Secondary | ICD-10-CM | POA: Diagnosis present

## 2015-04-28 DIAGNOSIS — O163 Unspecified maternal hypertension, third trimester: Secondary | ICD-10-CM

## 2015-04-28 HISTORY — DX: Supervision of pregnancy with other poor reproductive or obstetric history, unspecified trimester: O09.299

## 2015-04-28 HISTORY — DX: Gestational (pregnancy-induced) hypertension without significant proteinuria, unspecified trimester: O13.9

## 2015-04-28 LAB — COMPREHENSIVE METABOLIC PANEL
ALK PHOS: 123 U/L (ref 38–126)
ALT: 11 U/L — ABNORMAL LOW (ref 14–54)
ANION GAP: 10 (ref 5–15)
AST: 18 U/L (ref 15–41)
Albumin: 3.3 g/dL — ABNORMAL LOW (ref 3.5–5.0)
BILIRUBIN TOTAL: 0.5 mg/dL (ref 0.3–1.2)
BUN: <5 mg/dL — ABNORMAL LOW (ref 6–20)
CALCIUM: 10.1 mg/dL (ref 8.9–10.3)
CO2: 23 mmol/L (ref 22–32)
CREATININE: 0.38 mg/dL — AB (ref 0.44–1.00)
Chloride: 102 mmol/L (ref 101–111)
Glucose, Bld: 94 mg/dL (ref 65–99)
Potassium: 3.7 mmol/L (ref 3.5–5.1)
Sodium: 135 mmol/L (ref 135–145)
TOTAL PROTEIN: 6.9 g/dL (ref 6.5–8.1)

## 2015-04-28 LAB — DIFFERENTIAL
BASOS PCT: 0 %
Basophils Absolute: 0 10*3/uL (ref 0.0–0.1)
EOS ABS: 0.1 10*3/uL (ref 0.0–0.7)
EOS PCT: 1 %
Lymphocytes Relative: 20 %
Lymphs Abs: 2.1 10*3/uL (ref 0.7–4.0)
MONO ABS: 0.4 10*3/uL (ref 0.1–1.0)
Monocytes Relative: 4 %
Neutro Abs: 8 10*3/uL — ABNORMAL HIGH (ref 1.7–7.7)
Neutrophils Relative %: 75 %

## 2015-04-28 LAB — RAPID URINE DRUG SCREEN, HOSP PERFORMED
Amphetamines: NOT DETECTED
BARBITURATES: NOT DETECTED
Benzodiazepines: POSITIVE — AB
Cocaine: NOT DETECTED
Opiates: POSITIVE — AB
Tetrahydrocannabinol: POSITIVE — AB

## 2015-04-28 LAB — CBC
HCT: 33.5 % — ABNORMAL LOW (ref 36.0–46.0)
Hemoglobin: 11.4 g/dL — ABNORMAL LOW (ref 12.0–15.0)
MCH: 30.1 pg (ref 26.0–34.0)
MCHC: 34 g/dL (ref 30.0–36.0)
MCV: 88.4 fL (ref 78.0–100.0)
PLATELETS: 304 10*3/uL (ref 150–400)
RBC: 3.79 MIL/uL — ABNORMAL LOW (ref 3.87–5.11)
RDW: 13 % (ref 11.5–15.5)
WBC: 10.7 10*3/uL — ABNORMAL HIGH (ref 4.0–10.5)

## 2015-04-28 LAB — PROTEIN / CREATININE RATIO, URINE
Creatinine, Urine: 73 mg/dL
PROTEIN CREATININE RATIO: 0.16 mg/mg{creat} — AB (ref 0.00–0.15)
TOTAL PROTEIN, URINE: 12 mg/dL

## 2015-04-28 LAB — RAPID HIV SCREEN (HIV 1/2 AB+AG)
HIV 1/2 Antibodies: NONREACTIVE
HIV-1 P24 Antigen - HIV24: NONREACTIVE

## 2015-04-28 LAB — TYPE AND SCREEN
ABO/RH(D): A POS
Antibody Screen: NEGATIVE

## 2015-04-28 LAB — HEPATITIS B SURFACE ANTIGEN: Hepatitis B Surface Ag: NEGATIVE

## 2015-04-28 MED ORDER — LABETALOL HCL 5 MG/ML IV SOLN
20.0000 mg | INTRAVENOUS | Status: DC | PRN
Start: 2015-04-28 — End: 2015-04-29
  Filled 2015-04-28: qty 4

## 2015-04-28 MED ORDER — FENTANYL CITRATE (PF) 100 MCG/2ML IJ SOLN
100.0000 ug | INTRAMUSCULAR | Status: DC | PRN
  Administered 2015-04-28: 100 ug via INTRAVENOUS

## 2015-04-28 MED ORDER — CITRIC ACID-SODIUM CITRATE 334-500 MG/5ML PO SOLN: 30.0000 mL | ORAL | Status: DC | PRN

## 2015-04-28 MED ORDER — LIDOCAINE HCL (PF) 1 % IJ SOLN
30.0000 mL | INTRAMUSCULAR | Status: DC | PRN
  Filled 2015-04-28: qty 30

## 2015-04-28 MED ORDER — PHENYLEPHRINE 40 MCG/ML (10ML) SYRINGE FOR IV PUSH (FOR BLOOD PRESSURE SUPPORT)
80.0000 ug | PREFILLED_SYRINGE | INTRAVENOUS | Status: DC | PRN
  Filled 2015-04-28: qty 20
  Filled 2015-04-28: qty 2

## 2015-04-28 MED ORDER — HYDRALAZINE HCL 20 MG/ML IJ SOLN: 10.0000 mg | Freq: Once | INTRAMUSCULAR | Status: DC | PRN

## 2015-04-28 MED ORDER — FENTANYL CITRATE (PF) 100 MCG/2ML IJ SOLN
INTRAMUSCULAR | Status: AC
  Filled 2015-04-28: qty 2

## 2015-04-28 MED ORDER — ONDANSETRON HCL 4 MG/2ML IJ SOLN
4.0000 mg | Freq: Four times a day (QID) | INTRAMUSCULAR | Status: DC | PRN
  Administered 2015-04-28: 4 mg via INTRAVENOUS
  Filled 2015-04-28: qty 2

## 2015-04-28 MED ORDER — OXYTOCIN 40 UNITS IN LACTATED RINGERS INFUSION - SIMPLE MED
62.5000 mL/h | INTRAVENOUS | Status: DC
  Filled 2015-04-28: qty 1000

## 2015-04-28 MED ORDER — OXYTOCIN BOLUS FROM INFUSION
500.0000 mL | INTRAVENOUS | Status: DC
  Administered 2015-04-29: 500 mL via INTRAVENOUS

## 2015-04-28 MED ORDER — PROMETHAZINE HCL 25 MG/ML IJ SOLN
12.5000 mg | INTRAMUSCULAR | Status: DC | PRN
  Administered 2015-04-28: 12.5 mg via INTRAVENOUS
  Filled 2015-04-28: qty 1

## 2015-04-28 MED ORDER — OXYTOCIN 40 UNITS IN LACTATED RINGERS INFUSION - SIMPLE MED
1.0000 m[IU]/min | INTRAVENOUS | Status: DC
  Administered 2015-04-28: 2 m[IU]/min via INTRAVENOUS

## 2015-04-28 MED ORDER — LIDOCAINE HCL (PF) 1 % IJ SOLN
INTRAMUSCULAR | Status: DC | PRN
  Administered 2015-04-28 (×2): 8 mL via EPIDURAL

## 2015-04-28 MED ORDER — ACETAMINOPHEN 500 MG PO TABS
1000.0000 mg | ORAL_TABLET | Freq: Once | ORAL | Status: AC
  Administered 2015-04-28: 1000 mg via ORAL
  Filled 2015-04-28: qty 2

## 2015-04-28 MED ORDER — LACTATED RINGERS IV SOLN
INTRAVENOUS | Status: DC
  Administered 2015-04-28: 19:00:00 via INTRAVENOUS

## 2015-04-28 MED ORDER — FENTANYL 2.5 MCG/ML BUPIVACAINE 1/10 % EPIDURAL INFUSION (WH - ANES)
14.0000 mL/h | INTRAMUSCULAR | Status: DC | PRN
  Administered 2015-04-28 (×3): 14 mL/h via EPIDURAL
  Filled 2015-04-28 (×2): qty 125

## 2015-04-28 MED ORDER — DIPHENHYDRAMINE HCL 50 MG/ML IJ SOLN: 12.5000 mg | INTRAMUSCULAR | Status: DC | PRN

## 2015-04-28 MED ORDER — TERBUTALINE SULFATE 1 MG/ML IJ SOLN
0.2500 mg | Freq: Once | INTRAMUSCULAR | Status: DC | PRN
  Filled 2015-04-28: qty 1

## 2015-04-28 MED ORDER — LACTATED RINGERS IV SOLN
500.0000 mL | INTRAVENOUS | Status: DC | PRN
  Administered 2015-04-28: 500 mL via INTRAVENOUS

## 2015-04-28 MED ORDER — ACETAMINOPHEN 325 MG PO TABS
650.0000 mg | ORAL_TABLET | ORAL | Status: DC | PRN
Start: 2015-04-28 — End: 2015-04-29

## 2015-04-28 MED ORDER — EPHEDRINE 5 MG/ML INJ
10.0000 mg | INTRAVENOUS | Status: DC | PRN
  Filled 2015-04-28: qty 2

## 2015-04-28 MED ORDER — FLEET ENEMA 7-19 GM/118ML RE ENEM: 1.0000 | ENEMA | RECTAL | Status: DC | PRN

## 2015-04-28 NOTE — Anesthesia Preprocedure Evaluation (Signed)
Anesthesia Evaluation  Patient identified by MRN, date of birth, ID band Patient awake    Reviewed: Allergy & Precautions, H&P , NPO status , Patient's Chart, lab work & pertinent test results  Airway Mallampati: I  TM Distance: >3 FB Neck ROM: full    Dental no notable dental hx.    Pulmonary Current Smoker,    Pulmonary exam normal        Cardiovascular Normal cardiovascular exam     Neuro/Psych negative neurological ROS     GI/Hepatic negative GI ROS, Neg liver ROS,   Endo/Other  negative endocrine ROS  Renal/GU negative Renal ROS     Musculoskeletal   Abdominal Normal abdominal exam  (+)   Peds  Hematology negative hematology ROS (+)   Anesthesia Other Findings   Reproductive/Obstetrics (+) Pregnancy                             Anesthesia Physical Anesthesia Plan  ASA: II  Anesthesia Plan: Epidural   Post-op Pain Management:    Induction:   Airway Management Planned:   Additional Equipment:   Intra-op Plan:   Post-operative Plan:   Informed Consent: I have reviewed the patients History and Physical, chart, labs and discussed the procedure including the risks, benefits and alternatives for the proposed anesthesia with the patient or authorized representative who has indicated his/her understanding and acceptance.     Plan Discussed with:   Anesthesia Plan Comments:         Anesthesia Quick Evaluation

## 2015-04-28 NOTE — Telephone Encounter (Signed)
Pt stated she was in MAU when I contacted her.  I spoke with the MAU RN and requested she draw a prenatal panel for lab results needed whether she stayed or not.

## 2015-04-28 NOTE — MAU Note (Signed)
Pt states her water broke about 1200 with clear fluid.  Pt states the contractions started in the last hour.  Denies vaginal bleeding.  Good fetal movement.

## 2015-04-28 NOTE — Anesthesia Procedure Notes (Signed)
Epidural Patient location during procedure: OB Start time: 04/28/2015 5:25 PM End time: 04/28/2015 5:29 PM  Staffing Anesthesiologist: Leilani AbleHATCHETT, Liliah Dorian Performed by: anesthesiologist   Preanesthetic Checklist Completed: patient identified, surgical consent, pre-op evaluation, timeout performed, IV checked, risks and benefits discussed and monitors and equipment checked  Epidural Patient position: sitting Prep: site prepped and draped and DuraPrep Patient monitoring: continuous pulse ox and blood pressure Approach: midline Location: L3-L4 Injection technique: LOR air  Needle:  Needle type: Tuohy  Needle gauge: 17 G Needle length: 9 cm and 9 Needle insertion depth: 9 cm Catheter type: closed end flexible Catheter size: 19 Gauge Catheter at skin depth: 9 cm Test dose: negative and Other  Assessment Sensory level: T9 Events: blood not aspirated, injection not painful, no injection resistance, negative IV test and no paresthesia  Additional Notes Reason for block:procedure for pain

## 2015-04-28 NOTE — Telephone Encounter (Signed)
Preadmission screen  

## 2015-04-28 NOTE — H&P (Signed)
OBSTETRIC ADMISSION HISTORY AND PHYSICAL  Brandy Hickman is a 24 y.o. female [phone] with IUP at [redacted]w[redacted]d by 28wk6d presenting for ROM at . She reports +FMs, No LOF, no VB, no blurry vision, headaches or peripheral edema, and RUQ pain.  She plans on breastfeeding. She request BTL-at 6 weeks for birth control.  Dating: By Darrol Poke --->  Estimated Date of Delivery: 04/26/15   Prenatal History/Complications:  Past Medical History: Past Medical History  Diagnosis Date  . Manic-depressive disorder (HCC)   . Pregnancy induced hypertension     Past Surgical History: Past Surgical History  Procedure Laterality Date  . Tonsillectomy      Obstetrical History: OB History    Gravida Para Term Preterm AB TAB SAB Ectopic Multiple Living        Social History: Social History   Social History  . Marital Status: Single    Spouse Name: N/A  . Number of Children: N/A  . Years of Education: N/A   Social History Main Topics  . Smoking status: Current Every Day Smoker -- 1.00 packs/day for 13 years    Types: Cigarettes  . Smokeless tobacco: Never Used  . Alcohol Use: No  . Drug Use: No     Comment: Smoked pot before she found out she was pregnant  . Sexual Activity: Yes    Birth Control/ Protection: None   Other Topics Concern  . None   Social History Narrative   ** Merged History Encounter **        Family History: Family History  Problem Relation Age of Onset  . Clotting disorder Mother   . Spina bifida Sister   . Other Brother     Born at 26 weeks has a flute shunt in his brain  . Depression Brother   . Heart failure Maternal Grandmother   . Varicose Veins Maternal Grandmother   . Stroke Maternal Grandfather     Allergies: Allergies  Allergen Reactions  . Flexeril [Cyclobenzaprine] Swelling  . Hydrocodone Itching    Prescriptions prior to admission  Medication Sig Dispense Refill Last Dose  . acetaminophen (TYLENOL) 500 MG tablet Take 1,000 mg  by mouth every 6 (six) hours as needed for mild pain or headache.   04/28/2015 at Unknown time  . citalopram (CELEXA) 40 MG tablet Take 40 mg by mouth daily.   04/27/2015 at Unknown time  . oxyCODONE-acetaminophen (PERCOCET/ROXICET) 5-325 MG tablet Take 1 tablet by mouth every 8 (eight) hours as needed. (Patient taking differently: Take 1 tablet by mouth every 8 (eight) hours as needed for severe pain. ) 15 tablet 0 04/27/2015 at Unknown time  . Prenatal Vit-Fe Fumarate-FA (PRENATAL MULTIVITAMIN) TABS Take 1 tablet by mouth daily.    04/27/2015 at Unknown time  . promethazine (PHENERGAN) 12.5 MG tablet Take 1 tablet (12.5 mg total) by mouth every 6 (six) hours as needed for nausea or vomiting. 20 tablet 1 04/28/2015 at Unknown time     Review of Systems   All systems reviewed and negative except as stated in HPI  Blood pressure 150/95, pulse 75, temperature 98 F (36.7 C), temperature source Oral, resp. rate 20, SpO2 98 %. General appearance: alert, cooperative and appears older than stated age Lungs: clear to auscultation bilaterally Heart: regular rate and rhythm Abdomen: soft, non-tender; bowel sounds normal Pelvic: adequate Extremities: Homans sign is negative, no sign of DVT  Presentation: cephalic Fetal monitoringBaseline: 115 bpm, Variability: Good {>  6 bpm), Accelerations: Reactive and Decelerations: Absent Uterine activityFrequency: Every 7 minutes Dilation: 3 Effacement (%): 50 Station: -1 Exam by:: Edger HouseL. Paschal, RN   Prenatal labs: ABO, Rh: --/--/A POS, A POS (11/11 2237) Antibody: NEG (11/11 2237) Rubella: !Error! RPR:    HBsAg:    HIV:    GBS: Negative (12/21 1430)  1 hr Glucola-never done Genetic screening  Too late Anatomy US wnl  Prenatal Transfer Tool  Maternal Diabetes: No [facility] Screening[facility] M[facility] Ultrasounds/Referrals: Normal Fetal Ultrasounds or other Referrals:  None Maternal Substance Abuse:  No Significant Maternal Medications:   None Significant Maternal Lab Results: Lab values include: Group B Strep negative  No results found for this or any previous visit (from the past 24 hour(s)).  Patient Active Problem List   Diagnosis Date Noted  . Hypertension during pregnancy in third trimester 03/10/2015  . Gestational hypertension w/o significant proteinuria in 3rd trimester 03/10/2015  . Late prenatal care in third trimester 02/15/2015  . Supervision of normal pregnancy 02/15/2015  . History of preterm delivery, currently pregnant in third trimester 02/15/2015  . Hx of preeclampsia, prior pregnancy, currently pregnant 02/15/2015    Assessment: Brandy Hickman is a [age] y.o. [id number] at 2130w2d here for PROM  #Labor: expectant management vs augmentation with pitocin #Pain: Prn meds, epidural > 5cm #FWB: Cat I #ID:  GBS neg #MOF: Breast #MOC:BTL @6  weeks #Circ:  Female, circumcision at FT  #Elevated Blood pressures: Patient currently with HA and reports visual changes and RUQ pain.  She has a history of preeclampsia in prior pregnancies. Her current BP is 142/96 and last PNV on 12/28 she was 142/70. She meets criteria for gHTN at the last but might be showing s/sx of preeclampsia.  - CMP - CBC - UPC  [facility] 04/28/2015, 2:43 PM

## 2015-04-29 ENCOUNTER — Encounter (HOSPITAL_COMMUNITY): Payer: Self-pay

## 2015-04-29 DIAGNOSIS — O134 Gestational [pregnancy-induced] hypertension without significant proteinuria, complicating childbirth: Secondary | ICD-10-CM

## 2015-04-29 DIAGNOSIS — O99334 Smoking (tobacco) complicating childbirth: Secondary | ICD-10-CM

## 2015-04-29 DIAGNOSIS — Z3A40 40 weeks gestation of pregnancy: Secondary | ICD-10-CM

## 2015-04-29 DIAGNOSIS — F17210 Nicotine dependence, cigarettes, uncomplicated: Secondary | ICD-10-CM

## 2015-04-29 DIAGNOSIS — O4292 Full-term premature rupture of membranes, unspecified as to length of time between rupture and onset of labor: Secondary | ICD-10-CM

## 2015-04-29 LAB — CBC
HCT: 28.2 % — ABNORMAL LOW (ref 36.0–46.0)
HEMOGLOBIN: 9.6 g/dL — AB (ref 12.0–15.0)
MCH: 30.2 pg (ref 26.0–34.0)
MCHC: 34 g/dL (ref 30.0–36.0)
MCV: 88.7 fL (ref 78.0–100.0)
PLATELETS: 273 10*3/uL (ref 150–400)
RBC: 3.18 MIL/uL — AB (ref 3.87–5.11)
RDW: 13 % (ref 11.5–15.5)
WBC: 17.7 10*3/uL — AB (ref 4.0–10.5)

## 2015-04-29 LAB — RPR: RPR Ser Ql: NONREACTIVE

## 2015-04-29 LAB — HIV ANTIBODY (ROUTINE TESTING W REFLEX): HIV Screen 4th Generation wRfx: NONREACTIVE

## 2015-04-29 MED ORDER — TETANUS-DIPHTH-ACELL PERTUSSIS 5-2.5-18.5 LF-MCG/0.5 IM SUSP: 0.5000 mL | Freq: Once | INTRAMUSCULAR | Status: DC

## 2015-04-29 MED ORDER — DIPHENHYDRAMINE HCL 25 MG PO CAPS: 25.0000 mg | ORAL_CAPSULE | Freq: Four times a day (QID) | ORAL | Status: DC | PRN

## 2015-04-29 MED ORDER — SIMETHICONE 80 MG PO CHEW: 80.0000 mg | CHEWABLE_TABLET | ORAL | Status: DC | PRN

## 2015-04-29 MED ORDER — ZOLPIDEM TARTRATE 5 MG PO TABS: 5.0000 mg | ORAL_TABLET | Freq: Every evening | ORAL | Status: DC | PRN

## 2015-04-29 MED ORDER — PRENATAL MULTIVITAMIN CH
1.0000 | ORAL_TABLET | Freq: Every day | ORAL | Status: DC
  Administered 2015-04-29 – 2015-04-30 (×2): 1 via ORAL
  Filled 2015-04-29 (×2): qty 1

## 2015-04-29 MED ORDER — ACETAMINOPHEN 325 MG PO TABS
650.0000 mg | ORAL_TABLET | ORAL | Status: DC | PRN
  Administered 2015-04-29 (×3): 650 mg via ORAL
  Filled 2015-04-29 (×3): qty 2

## 2015-04-29 MED ORDER — DIBUCAINE 1 % RE OINT: 1.0000 "application " | TOPICAL_OINTMENT | RECTAL | Status: DC | PRN

## 2015-04-29 MED ORDER — OXYCODONE-ACETAMINOPHEN 5-325 MG PO TABS: 2.0000 | ORAL_TABLET | ORAL | Status: DC | PRN

## 2015-04-29 MED ORDER — ONDANSETRON HCL 4 MG PO TABS: 4.0000 mg | ORAL_TABLET | ORAL | Status: DC | PRN

## 2015-04-29 MED ORDER — WITCH HAZEL-GLYCERIN EX PADS
1.0000 | MEDICATED_PAD | CUTANEOUS | Status: DC | PRN
Start: 2015-04-29 — End: 2015-04-30

## 2015-04-29 MED ORDER — BENZOCAINE-MENTHOL 20-0.5 % EX AERO
1.0000 | INHALATION_SPRAY | CUTANEOUS | Status: DC | PRN
Start: 2015-04-29 — End: 2015-04-30

## 2015-04-29 MED ORDER — IBUPROFEN 600 MG PO TABS
600.0000 mg | ORAL_TABLET | Freq: Four times a day (QID) | ORAL | Status: DC
  Administered 2015-04-29 – 2015-04-30 (×6): 600 mg via ORAL
  Filled 2015-04-29 (×6): qty 1

## 2015-04-29 MED ORDER — SENNOSIDES-DOCUSATE SODIUM 8.6-50 MG PO TABS
2.0000 | ORAL_TABLET | ORAL | Status: DC
  Administered 2015-04-29: 2 via ORAL
  Filled 2015-04-29: qty 2

## 2015-04-29 MED ORDER — LANOLIN HYDROUS EX OINT: TOPICAL_OINTMENT | CUTANEOUS | Status: DC | PRN

## 2015-04-29 MED ORDER — ONDANSETRON HCL 4 MG/2ML IJ SOLN: 4.0000 mg | INTRAMUSCULAR | Status: DC | PRN

## 2015-04-29 MED ORDER — OXYCODONE-ACETAMINOPHEN 5-325 MG PO TABS: 1.0000 | ORAL_TABLET | ORAL | Status: DC | PRN

## 2015-04-29 NOTE — Progress Notes (Signed)
Patient ID: Wendi MayaLendi Sibal, female   DOB: 1991/04/27, 24 y.o.   MRN: [id number] Delivery Note At  a viable and healthy female was delivered via  (Presentation:vertex, OA ;  ).  APGAR:9,9 , ; weight  .   Placenta status: ,Tomasa BlaseSchultz, 3VC .  Cord:  with the following complications: .  Cord pH: not required  Anesthesia:  epidural Episiotomy:  none Lacerations:  none Suture Repair:  Est. Blood Loss (mL):  250  Mom to postpartum.  Baby to Couplet care / Skin to Skin.  Ladavia Lindenbaum V 04/29/2015, 12:59 AM

## 2015-04-29 NOTE — Lactation Note (Signed)
This note was copied from the chart of Brandy Hickman [place]. Lactation Consultation Note: Experienced BF mom has baby latched to breast when I went into room. Reports no pain with latch and has had several feedings already today. No questions at present. BF brochure given with resources for support after DC. To call prn  Patient Name: Brandy Hickman Christiana ZOXWR'UToday's Date: 04/29/2015 Reason for consult: Initial assessment   Maternal Data Formula Feeding for Exclusion: No Does the patient have breastfeeding experience prior to this delivery?: Yes  Feeding   LATCH Score/Interventions Latch: Grasps breast easily, tongue down, lips flanged, rhythmical sucking.  Audible Swallowing: A few with stimulation  Type of Nipple: Everted at rest and after stimulation  Comfort (Breast/Nipple): Soft / non-tender     Hold (Positioning): No assistance needed to correctly position infant at breast.  LATCH Score: 9  Lactation Tools Discussed/Used     Consult Status Consult Status: PRN    Pamelia HoitWeeks, Dnasia Gauna D 04/29/2015, 12:10 PM

## 2015-04-29 NOTE — Anesthesia Postprocedure Evaluation (Signed)
Anesthesia Post Note  Patient: Brandy Hickman  Procedure(s) Performed: * No procedures listed *  Patient location during evaluation: Mother Baby Anesthesia Type: Epidural Level of consciousness: awake and alert, oriented and patient cooperative Pain management: pain level controlled Vital Signs Assessment: post-procedure vital signs reviewed and stable Respiratory status: spontaneous breathing Cardiovascular status: stable Postop Assessment: no headache, epidural receding, patient able to bend at knees and no signs of nausea or vomiting Anesthetic complications: no    Last Vitals:  Filed Vitals:   04/29/15 0300 04/29/15 0415  BP: 125/58 134/51  Pulse: 58 55  Temp: 36.7 C 36.7 C  Resp: 16 16    Last Pain:  Filed Vitals:   04/29/15 0655  PainSc: Asleep                 Brandy PumaWRINKLE,Brock Larmon

## 2015-04-30 DIAGNOSIS — O26893 Other specified pregnancy related conditions, third trimester: Secondary | ICD-10-CM

## 2015-04-30 DIAGNOSIS — M549 Dorsalgia, unspecified: Secondary | ICD-10-CM

## 2015-04-30 MED ORDER — CITALOPRAM HYDROBROMIDE 40 MG PO TABS: 40.0000 mg | ORAL_TABLET | Freq: Every day | ORAL | Status: AC

## 2015-04-30 MED ORDER — IBUPROFEN 600 MG PO TABS: 600.0000 mg | ORAL_TABLET | Freq: Four times a day (QID) | ORAL | Status: DC | PRN

## 2015-04-30 NOTE — Progress Notes (Signed)
CLINICAL SOCIAL WORK MATERNAL/CHILD NOTE  Patient Details  Name: Brandy Hickman MRN: [id number] Date of Birth: 04/29/2017  Date: 04/30/2015  Clinical Social Worker Initiating Note: Ziyah Cordoba, LCSWDate/ Time Initiated: 04/30/15/1030   Child's Name: Brandy Hickman   Legal Guardian:  Brandy Hickman)   Need for Interpreter: None   Date of Referral: 04/29/15   Reason for Referral: Other (Comment)   Referral Source: [facility]   Address: [address] [place], [place] [address]  Phone number:  [phone])   Household Members: Minor Children, Significant Other   Natural Supports (not living in the home): Extended Family   Professional Supports:None   Employment: (FOB is employed)   Type of Work:     Education:     Museum/gallery curator Resources:Medicaid   Other Resources: Physicist, medical  (Plan to apply for [facility])   Cultural/Religious Considerations Which May Impact Care: none noted  Strengths: Ability to meet basic needs , Home prepared for child    Risk Factors/Current Problems:  (Mother was positive for opiates, benxodiazepines, and marijuana)   Cognitive State: Alert , Able to Concentrate    Mood/Affect: Happy    CSW Assessment: Acknowledged order for social work consult to address concerns regarding hx of substance abuse and mental illness. Met with mother who was pleasant and receptive to CSW. FOB was present but asleep and left the room when he woke up. Mother states that she and FOB reside together and she has 3 other dependents Brandy Hickman -7, Brandy Hickman -4, and Brandy Hickman 3). Informed that she also had a baby that died of SIDS in 2396 at 37 days old. Informed that she maintains custody of all of her children. She notes that biological father of her children is caring for them while she's hospitalized. Mother admits to hx of substance abuse. Informed that she was prescribed  oxycodone and Xanax, but denies any use in the past 10 weeks. She also admits to occasional use of marijuana. She denies any other illicit drug use or alcohol use during pregnancy. Mother states that she had late prenatal care because she did not become aware of the pregnancy until the beginning of the 3rd trimester. Mother reports hx of "manic depression" and states that she is being treated with celexa. UDS on newborn is positive for marijuana. Mother informed of the hospital's drug screen policy and referral that will be made to DSS. She denies any hx of DSS involvement.   CSW Plan/Description:    Discussed signs/symptoms of PP Depression and available resources Case was referred to DSS. Spoke with April Durden [phone] and informed that case will be classified as "immediate response" and she will be in to see mother today.  Spoke with MD and informed that there are no d/c plan for newborn today.  CSW will continue to follow regarding discharge dispostion.   Jager Koska J, LCSW 04/30/2015, 2:53 PM

## 2015-04-30 NOTE — Discharge Summary (Signed)
OB Discharge Summary     Patient Name: Brandy Hickman DOB: February 17, 1991 MRN: [id number]  Date of admission: 04/28/2015 Delivering MD: Tilda BurrowFERGUSON, Alisha Bacus V   Date of discharge: 04/30/2015  Admitting diagnosis: 41wks, water broke Intrauterine pregnancy: 2868w3d     Secondary diagnosis:  Active Problems:   Back pain during pregnancy in third trimester  Additional problems: history of Bipolar, not currently on meds     Discharge diagnosis: Term Pregnancy Delivered                                                                                               Chronic back pain, resolved Post partum procedures:Hotel status for baby care x 72 hrs after d/c due to hx opiate use.  Augmentation: Pitocin  Complications: None  Hospital course:  Onset of Labor With Vaginal Delivery     25 y.o. yo [id number] at 2068w3d was admitted in Latent Labor on 04/28/2015. Patient had an uncomplicated labor course as follows:  Membrane Rupture Time/Date: 10:00 AM ,04/28/2015   Intrapartum Procedures: Episiotomy: None [1]                                         Lacerations:  None [1]  Patient had a delivery of a Viable infant. 04/29/2015  Information for the patient's newborn:  Letha CapeWilliamson, Boy Inga [[id number]][[id number]]  Delivery Method: Vaginal, Spontaneous Delivery (Filed from Delivery Summary)    Pateint had an uncomplicated postpartum course.  She is ambulating, tolerating a regular diet, passing flatus, and urinating well. Patient is discharged to hospital hotel status, so that baby can be observed for pt's opiate use in 3rd trimester,in stable condition on 04/30/2015.    Physical exam  Filed Vitals:   04/29/15 0800 04/29/15 1525 04/29/15 1734 04/30/15 0540  BP: 108/68 114/66 124/78 138/87  Pulse: 72 61 71 51  Temp: 97.6 F (36.4 C) 98.4 F (36.9 C) 97.9 F (36.6 C) 97.9 F (36.6 C)  TempSrc: Oral Oral Oral Oral  Resp: 16 16 18 18   Height:      Weight:      SpO2:  97% 100%    General: alert and no  distress Lochia: appropriate Uterine Fundus: firm Incision: N/A DVT Evaluation: No evidence of DVT seen on physical exam. Labs: Lab Results  Component Value Date   WBC 17.7* 04/29/2015   HGB 9.6* 04/29/2015   HCT 28.2* 04/29/2015   MCV 88.7 04/29/2015   PLT 273 04/29/2015   CMP Latest Ref Rng 04/28/2015  Glucose 65 - 99 mg/dL 94  BUN 6 - 20 mg/dL <8(A<5(L)  Creatinine 4.160.44 - 1.00 mg/dL 6.06(T0.38(L)  Sodium 016135 - 010145 mmol/L 135  Potassium 3.5 - 5.1 mmol/L 3.7  Chloride 101 - 111 mmol/L 102  CO2 22 - 32 mmol/L 23  Calcium 8.9 - 10.3 mg/dL 93.210.1  Total Protein 6.5 - 8.1 g/dL 6.9  Total Bilirubin 0.3 - 1.2 mg/dL 0.5  Alkaline Phos 38 - 126 U/L 123  AST 15 - 41 U/L 18  ALT 14 - 54 U/L 11(L)    Discharge instruction: per After Visit Summary and "Baby and Me Booklet".  After visit meds:    Medication List    ASK your doctor about these medications        acetaminophen 500 MG tablet  Commonly known as:  TYLENOL  Take 1,000 mg by mouth every 6 (six) hours as needed for mild pain or headache.     citalopram 40 MG tablet  Commonly known as:  CELEXA  Take 40 mg by mouth daily.     oxyCODONE-acetaminophen 5-325 MG tablet  Commonly known as:  PERCOCET/ROXICET  Take 1 tablet by mouth every 8 (eight) hours as needed.     prenatal multivitamin Tabs tablet  Take 1 tablet by mouth daily.     promethazine 12.5 MG tablet  Commonly known as:  PHENERGAN  Take 1 tablet (12.5 mg total) by mouth every 6 (six) hours as needed for nausea or vomiting.        Diet: routine diet  Activity: Advance as tolerated. Pelvic rest for 6 weeks.   Outpatient follow up:4 wk for preop for PP BTL, 1 wk for Circ. Follow up Appt:Future Appointments Date Time Provider Department Center  05/25/2015 2:00 PM Tilda Burrow, MD [facility]   Follow up Visit:No Follow-up on file.  Postpartum contraception: Tubal Ligation  Newborn Data: Live born female  Birth Weight: 6 lb 11.1 oz (3035 g) APGAR:  9, 9  Baby Feeding: Breast Disposition:rooming in   04/30/2015 Tilda Burrow, MD

## 2015-05-01 ENCOUNTER — Ambulatory Visit: Payer: Self-pay

## 2015-05-01 NOTE — Lactation Note (Signed)
This note was copied from the chart of Brandy Wendi MayaLendi Peeks. Lactation Consultation Note  Patient Name: Brandy Hickman ZOXWR'UToday's Date: 05/01/2015 Reason for consult: Follow-up assessment [facility] 6360 [facility]. [facility]. Mom is experienced BF with previous children. Mom reports that her breasts were getting really hard but that she used the hand pump prior to BF and had an ice pack to use after BF. Mom stated she had just finished BF on left breast & it felt softer. Mom was now latching baby on right breast in cradle hold with no pillow support. Encouraged mom to use pillows to raise baby to breast level and suggested mom try cross-cradle to have a little more support- reminded mom that newborns need the head/neck support compared to older BF babies. Other than the hardness in her breasts, mom reports no pain or discomfort with BF. Mom reported no ?s. Encour[facility] to [facility] if she has further questions later.   Maternal Data    Feeding Feeding Type: Breast Fed  LATCH Score/Interventions Latch: Grasps breast easily, tongue down, lips flanged, rhythmical sucking.  Audible Swallowing: Spontaneous and intermittent Intervention(s): Hand expression  Type of Nipple: Everted at rest and after stimulation  Comfort (Breast/Nipple): Engorged, cracked, bleeding, large blisters, severe discomfort Problem noted: Engorgment Intervention(s): Ice     Hold (Positioning): Assistance needed to correctly position infant at breast and maintain latch. Intervention(s): Support Pillows;Position options  LATCH Score: 7  Lactation Tools Discussed/Used Pump Review: Setup, frequency, and cleaning Initiated by:: RN Date initiated:: 05/01/15   Consult Status Consult Status: Follow-up Date: 05/02/15 Follow-up type: In-patient    Oneal GroutLaura C Jakobe Blau 05/01/2015, 2:21 PM

## 2015-05-02 ENCOUNTER — Ambulatory Visit: Payer: Self-pay

## 2015-05-02 NOTE — Lactation Note (Signed)
This note was copied from the chart of Brandy Hickman. Lactation Consultation Note  Mother's breasts are engorged.  Discussed engorgement care w/ her - be sure to breastfeed often, relax sit back, massage breast during feeding and allow baby to empty breast fully. Apply ice packs after breastfeeding every 2-3 hours top and bottom.  Lie flat in bed and massage breasts toward armpit and collarbone to soften. Post pump if needed for only 5 min to soften before and after feedings. Baby latches easily.  Sucks and swallows observed for more than 10 min.  Breast began to soften during feeding. Encouraged mother to breastfeed with cues and not wait. Provided pillows under baby to bring to nipple height. Baby weight has stabilized and stools are yellow and seedy. Refilled ice packs and placed on other breast during feeding. Mother seems to understand plan.  Encouraged her to rest and breastfeed often. Continue to monitor stools.  Peds appt tomorrow.  Suggest she call if she needs further assistance.  Patient Name: Brandy Wendi MayaLendi Axe IONGE'XToday's Date: 05/02/2015 Reason for consult: Follow-up assessment   Maternal Data    Feeding Feeding Type: Breast Fed Length of feed: 20 min  LATCH Score/Interventions Latch: Grasps breast easily, tongue down, lips flanged, rhythmical sucking.  Audible Swallowing: Spontaneous and intermittent  Type of Nipple: Everted at rest and after stimulation  Comfort (Breast/Nipple): Engorged, cracked, bleeding, large blisters, severe discomfort     Hold (Positioning): No assistance needed to correctly position infant at breast.  LATCH Score: 8  Lactation Tools Discussed/Used     Consult Status Consult Status: Follow-up Date: 05/03/15 Follow-up type: In-patient    Dahlia ByesBerkelhammer, Evalyn Shultis [facility] 05/02/2015, 11:03 AM

## 2015-05-03 ENCOUNTER — Inpatient Hospital Stay (HOSPITAL_COMMUNITY): Admission: RE | Admit: 2015-05-03 | Payer: No Typology Code available for payment source | Source: Ambulatory Visit

## 2015-05-03 ENCOUNTER — Telehealth: Payer: Self-pay | Admitting: *Deleted

## 2015-05-03 NOTE — Telephone Encounter (Signed)
Brandy Hickman, Social Worker with [employer], states pt and infant positive for Opiates and pt states Dr. Emelda FearFerguson prescribed Oxycodone during pregnancy. Also pt states was documented in PN records pt was on Celexa.   Informed Brandy Hickman that Celexa was under pt medication list and Oxycodone had been prescribed during pregnancy for "pelvic discomfort for multiparity" per Dr. Emelda FearFerguson note.

## 2015-05-16 LAB — RUBELLA SCREEN: Rubella: 1.64 index (ref >0.99)

## 2015-05-17 NOTE — Progress Notes (Signed)
Post discharge chart review completed.  

## 2015-05-25 ENCOUNTER — Ambulatory Visit (INDEPENDENT_AMBULATORY_CARE_PROVIDER_SITE_OTHER): Payer: Medicaid Other | Admitting: Obstetrics and Gynecology

## 2015-05-25 ENCOUNTER — Encounter: Payer: Self-pay | Admitting: Obstetrics and Gynecology

## 2015-05-25 DIAGNOSIS — Z302 Encounter for sterilization: Secondary | ICD-10-CM | POA: Diagnosis not present

## 2015-05-25 DIAGNOSIS — Z309 Encounter for contraceptive management, unspecified: Secondary | ICD-10-CM | POA: Insufficient documentation

## 2015-05-25 NOTE — Progress Notes (Signed)
Patient ID: Brandy Hickman, female   DOB: May 26, 1990, 25 y.o.   MRN: [id number]  Subjective:  Brandy Hickman is a 25 y.o. female who presents for a 4 weeks postpartum visit  Patient concerns: Pt would like to discuss tubal ligation. She reports she feels mentally and physically well s/p delivery. She notes that she is breastfeeding. She also complains of 3 days of bleeding 4 days after delivery accompanied by HA with photophobia.   Prenatal and intrapartum course notable for normal vaginal delivery   Patient is not sexually active.   The following portions of the patient's history were reviewed and updated as appropriate: allergies, current medications, past family history, past medical history, past surgical history and problem list.  Review of Systems    See Subjective, otherwise negative ROS.  Objective:  BP 100/50 mmHg  Ht  (1.575 m)  Wt 118 lb (53.524 kg)  BMI 21.58 kg/m2  Breastfeeding? Yes  General:  alert, cooperative and no distress     Lungs: clear to auscultation bilaterally  Heart:  regular rate and rhythm, S1, S2 normal, no murmur  Abdomen: soft, non-tender; bowel sounds normal; no masses,  no organomegaly   Vulva:  normal  Vagina: normal vagina; minimal tissue thinning   Cervix:  normal  Corpus: normal size, contour, position, consistency, mobility, non-tender  Adnexa:  normal adnexa          Assessment:  1.  postpartum exam.  2. Contraception: tubal ligation discussed  3.  Excellent postpartum state  Plan:  Schedule tubal ligation    By signing my name below, I, Doreatha Martin, attest that this documentation has been prepared under the direction and in the presence of Tilda Burrow, MD. Electronically Signed: Doreatha Martin, ED Scribe. 05/25/2015. 3:21 PM.  I personally performed the services described in this documentation, which was SCRIBED in my presence. The recorded information has been reviewed and considered accurate. It has been edited as  necessary during review. Tilda Burrow, MD

## 2015-06-21 NOTE — Patient Instructions (Signed)
Brandy Hickman  06/21/2015     @   Your procedure is scheduled on 06/27/2015.  Report to [facility] at 9:00 A.M.  Call this number if you have problems the morning of surgery:  [phone]   Remember:  Do not eat food or drink liquids after midnight.  Take these medicines the morning of surgery with A SIP OF WATER Celexa   Do not wear jewelry, make-up or nail polish.  Do not wear lotions, powders, or perfumes.  You may wear deodorant.  Do not shave 48 hours prior to surgery.  Men may shave face and neck.  Do not bring valuables to the hospital.  [facility] is not responsible for any belongings or valuables.  Contacts, dentures or bridgework may not be worn into surgery.  Leave your suitcase in the car.  After surgery it may be brought to your room.  For patients admitted to the hospital, discharge time will be determined by your treatment team.  Patients discharged the day of surgery will not be allowed to drive home.    Please read over the following fact sheets that you were given. Surgical Site Infection Prevention and Anesthesia Post-op Instructions     PATIENT INSTRUCTIONS POST-ANESTHESIA  IMMEDIATELY FOLLOWING SURGERY:  Do not drive or operate machinery for the first twenty four hours after surgery.  Do not make any important decisions for twenty four hours after surgery or while taking narcotic pain medications or sedatives.  If you develop intractable nausea and vomiting or a severe headache please notify your doctor immediately.  FOLLOW-UP:  Please make an appointment with your surgeon as instructed. You do not need to follow up with anesthesia unless specifically instructed to do so.  WOUND CARE INSTRUCTIONS (if applicable):  Keep a dry clean dressing on the anesthesia/puncture wound site if there is drainage.  Once the wound has quit draining you may leave it open to air.  Generally you should leave the bandage intact for twenty four hours  unless there is drainage.  If the epidural site drains for more than 36-48 hours please call the anesthesia department.  QUESTIONS?:  Please feel free to call your physician or the hospital operator if you have any questions, and they will be happy to assist you.      Salpingectomy Salpingectomy, also called tubectomy, is the surgical removal of one of the fallopian tubes. The fallopian tubes are tubes that are connected to the uterus. These tubes transport the egg from the ovary to the uterus. A salpingectomy may be done for various reasons, including:   A tubal (ectopic) pregnancy. This is especially true if the tube ruptures.  An infected fallopian tube.  The need to remove the fallopian tube when removing an ovary with a cyst or tumor.  The need to remove the fallopian tube when removing the uterus.  Cancer of the fallopian tube or nearby organs. Removing one fallopian tube does not prevent you from becoming pregnant. It also does not cause problems with your menstrual periods.  LET [facility] CARE PROVIDER KNOW ABOUT:  Any allergies you have.  All medicines you are taking, including vitamins, herbs, eye drops, creams, and over-the-counter medicines.  Previous problems you or members of your family have had with the use of anesthetics.  Any blood disorders you have.  Previous surgeries you have had.  Medical conditions you have. RISKS AND COMPLICATIONS  Generally, this is a safe procedure. However, as with any procedure, complications can occur.  Possible complications include:  Injury to surrounding organs.  Bleeding.  Infection.  Problems related to anesthesia. BEFORE THE PROCEDURE  Ask your health care provider about changing or stopping your regular medicines. You may need to stop taking certain medicines, such as aspirin or blood thinners, at least 1 week before the surgery.  Do not eat or drink anything for at least 8 hours before the surgery.  If you smoke,  do not smoke for at least 2 weeks before the surgery.  Make plans to have someone drive you home after the procedure or after your hospital stay. Also arrange for someone to help you with activities during recovery. PROCEDURE   You will be given medicine to help you relax before the procedure (sedative). You will then be given medicine to make you sleep through the procedure (general anesthetic). These medicines will be given through an IV access tube that is put into one of your veins.  Once you are asleep, your lower abdomen will be shaved and cleaned. A thin, flexible tube (catheter) will be placed in your bladder.  The surgeon may use a laparoscopic, robotic, or open technique for this surgery:  In the laparoscopic technique, the surgery is done through two small cuts (incisions) in the abdomen. A thin, lighted tube with a tiny camera on the end (laparoscope) is inserted into one of the incisions. The tools needed for the procedure are put through the other incision.  A robotic technique may be chosen to perform complex surgery in a small space. In the robotic technique, small incisions will be made. A camera and surgical instruments are passed through the incisions. Surgical instruments will be controlled with the help of a robotic arm.  In the open technique, the surgery is done through one large incision in the abdomen.  Using any of these techniques, the surgeon removes the fallopian tube from where it attaches to the uterus. The blood vessels will be clamped and tied.  The surgeon then uses staples or stitches to close the incision or incisions. AFTER THE PROCEDURE   You will be taken to a recovery area where your progress will be monitored for 1-3 hours.  If the laparoscopic technique was used, you may be allowed to go home after several hours. You may have some shoulder pain after the laparoscopic procedure. This is normal and usually goes away in a day or two.  If the open  technique was used, you will be admitted to the hospital for a couple of days.  You will be given pain medicine if needed.  The IV access tube and catheter will be removed before you are discharged.   This information is not intended to replace advice given to you by your health care provider. Make sure you discuss any questions you have with your health care provider.   Document Released: 09/01/2008 Document Revised: 05/06/2014 Document Reviewed: 10/07/2012 Elsevier Interactive Patient Education [employer]

## 2015-06-22 ENCOUNTER — Other Ambulatory Visit: Payer: Self-pay | Admitting: Obstetrics and Gynecology

## 2015-06-22 ENCOUNTER — Encounter (HOSPITAL_COMMUNITY)
Admission: RE | Admit: 2015-06-22 | Discharge: 2015-06-22 | Disposition: A | Discharge status: Home / Self Care | Payer: Medicaid Other | Source: Ambulatory Visit | Attending: Obstetrics and Gynecology | Admitting: Obstetrics and Gynecology

## 2015-06-22 ENCOUNTER — Encounter (HOSPITAL_COMMUNITY): Payer: Self-pay

## 2015-06-22 DIAGNOSIS — Z01812 Encounter for preprocedural laboratory examination: Secondary | ICD-10-CM | POA: Insufficient documentation

## 2015-06-22 LAB — URINALYSIS, ROUTINE W REFLEX MICROSCOPIC
BILIRUBIN URINE: NEGATIVE
Glucose, UA: NEGATIVE mg/dL
HGB URINE DIPSTICK: NEGATIVE
KETONES UR: NEGATIVE mg/dL
Leukocytes, UA: NEGATIVE
NITRITE: NEGATIVE
PH: 6 (ref 5.0–8.0)
Protein, ur: NEGATIVE mg/dL
Specific Gravity, Urine: 1.02 (ref 1.005–1.030)

## 2015-06-22 LAB — COMPREHENSIVE METABOLIC PANEL
ALK PHOS: 47 U/L (ref 38–126)
ALT: 14 U/L (ref 14–54)
ANION GAP: 9 (ref 5–15)
AST: 17 U/L (ref 15–41)
Albumin: 3.9 g/dL (ref 3.5–5.0)
BILIRUBIN TOTAL: 0.4 mg/dL (ref 0.3–1.2)
BUN: 14 mg/dL (ref 6–20)
CALCIUM: 9.1 mg/dL (ref 8.9–10.3)
CO2: 26 mmol/L (ref 22–32)
CREATININE: 0.54 mg/dL (ref 0.44–1.00)
Chloride: 101 mmol/L (ref 101–111)
GFR calc non Af Amer: >60 mL/min (ref >60)
Glucose, Bld: 111 mg/dL — ABNORMAL HIGH (ref 65–99)
Potassium: 4 mmol/L (ref 3.5–5.1)
Sodium: 136 mmol/L (ref 135–145)
TOTAL PROTEIN: 6.9 g/dL (ref 6.5–8.1)

## 2015-06-22 LAB — RAPID URINE DRUG SCREEN, HOSP PERFORMED
Amphetamines: NOT DETECTED
Barbiturates: NOT DETECTED
Benzodiazepines: NOT DETECTED
COCAINE: NOT DETECTED
OPIATES: POSITIVE — AB
TETRAHYDROCANNABINOL: POSITIVE — AB

## 2015-06-22 LAB — CBC
HEMATOCRIT: 35.3 % — AB (ref 36.0–46.0)
HEMOGLOBIN: 12.1 g/dL (ref 12.0–15.0)
MCH: 30.2 pg (ref 26.0–34.0)
MCHC: 34.3 g/dL (ref 30.0–36.0)
MCV: 88 fL (ref 78.0–100.0)
Platelets: 295 10*3/uL (ref 150–400)
RBC: 4.01 MIL/uL (ref 3.87–5.11)
RDW: 13.1 % (ref 11.5–15.5)
WBC: 10.4 10*3/uL (ref 4.0–10.5)

## 2015-06-22 LAB — HCG, SERUM, QUALITATIVE: Preg, Serum: NEGATIVE

## 2015-06-22 NOTE — Pre-Procedure Instructions (Signed)
Patient given information to sign up for my chart at home. 

## 2015-06-22 NOTE — H&P (Signed)
  Pacer Dorn V, MD at 05/25/2015 3:21 PM     Status: Signed       Expand All Collapse All   Patient ID: Brandy Hickman, female DOB: 11/09/1990, 24 y.o. MRN: [id number]  Subjective:  Brandy Hickman is a 24 y.o. female who presents for a 4 weeks postpartum visit  Patient concerns: Pt would like to discuss tubal ligation. She reports she feels mentally and physically well s/p delivery. She notes that she is breastfeeding. She also complains of 3 days of bleeding 4 days after delivery accompanied by HA with photophobia.  She has requested permanent sterilization, and we have reviewed the procedure, Laparoscopic bilateral salpingectomy,  AND  PT acknowledges the intended permanency of requested procedure, and the potential failure , and potential operative risks. [employer] instruction booklets used as reference.  Prenatal and intrapartum course notable for normal vaginal delivery  Patient is not sexually active.   The following portions of the patient's history were reviewed and updated as appropriate: allergies, current medications, past family history, past medical history, past surgical history and problem list.  Review of Systems See Subjective, otherwise negative ROS.  Objective:  BP 100/50 mmHg  Ht 5' 2" (1.575 m)  Wt 118 lb (53.524 kg)  BMI 21.58 kg/m2  Breastfeeding? Yes  General:  alert, cooperative and no distress     Lungs: clear to auscultation bilaterally  Heart:  regular rate and rhythm, S1, S2 normal, no murmur  Abdomen: soft, non-tender; bowel sounds normal; no masses, no organomegaly  Vulva: normal  Vagina: normal vagina; minimal tissue thinning   Cervix:  normal  Corpus: normal size, contour, position, consistency, mobility, non-tender  Adnexa:  normal adnexa        Assessment:  1. postpartum exam.  2. Contraception: tubal ligation discussed and agreed to perform. 3. Excellent postpartum state  Plan:   Schedule tubal ligation , Laparoscopic bilateral salpingectomy, on 28 Feb 17.  By signing my name below, I, Eva Mathews, attest that this documentation has been prepared under the direction and in the presence of Ember Gottwald V, MD. Electronically Signed: Eva Mathews, ED Scribe. 05/25/2015. 3:21 PM.  I personally performed the services described in this documentation, which was SCRIBED in my presence. The recorded information has been reviewed and considered accurate. It has been edited as necessary during review. Raaga Maeder V, MD           

## 2015-06-27 ENCOUNTER — Ambulatory Visit (HOSPITAL_COMMUNITY)
Admission: RE | Admit: 2015-06-27 | Discharge: 2015-06-27 | Disposition: A | Discharge status: Home / Self Care | Payer: Medicaid Other | Source: Ambulatory Visit | Attending: Obstetrics and Gynecology | Admitting: Obstetrics and Gynecology

## 2015-06-27 ENCOUNTER — Encounter (HOSPITAL_COMMUNITY)
Admission: RE | Disposition: A | Discharge status: Home / Self Care | Payer: Self-pay | Source: Ambulatory Visit | Attending: Obstetrics and Gynecology

## 2015-06-27 ENCOUNTER — Ambulatory Visit (HOSPITAL_COMMUNITY): Payer: Medicaid Other | Admitting: Anesthesiology

## 2015-06-27 DIAGNOSIS — Z79899 Other long term (current) drug therapy: Secondary | ICD-10-CM | POA: Diagnosis not present

## 2015-06-27 DIAGNOSIS — F319 Bipolar disorder, unspecified: Secondary | ICD-10-CM | POA: Insufficient documentation

## 2015-06-27 DIAGNOSIS — I10 Essential (primary) hypertension: Secondary | ICD-10-CM | POA: Diagnosis not present

## 2015-06-27 DIAGNOSIS — F17200 Nicotine dependence, unspecified, uncomplicated: Secondary | ICD-10-CM | POA: Insufficient documentation

## 2015-06-27 DIAGNOSIS — Z309 Encounter for contraceptive management, unspecified: Secondary | ICD-10-CM

## 2015-06-27 DIAGNOSIS — Z302 Encounter for sterilization: Secondary | ICD-10-CM

## 2015-06-27 HISTORY — PX: LAPAROSCOPIC BILATERAL SALPINGECTOMY: SHX5889

## 2015-06-27 IMAGING — CR DG FOOT 2V*L*
2 series · 2 of 2 positions shown · non-contrast
Comparison: Radiography from earlier the same day

CLINICAL DATA: Foot pain.

EXAM:
LEFT FOOT - 2 VIEW

[view not recorded (1 of 2)]
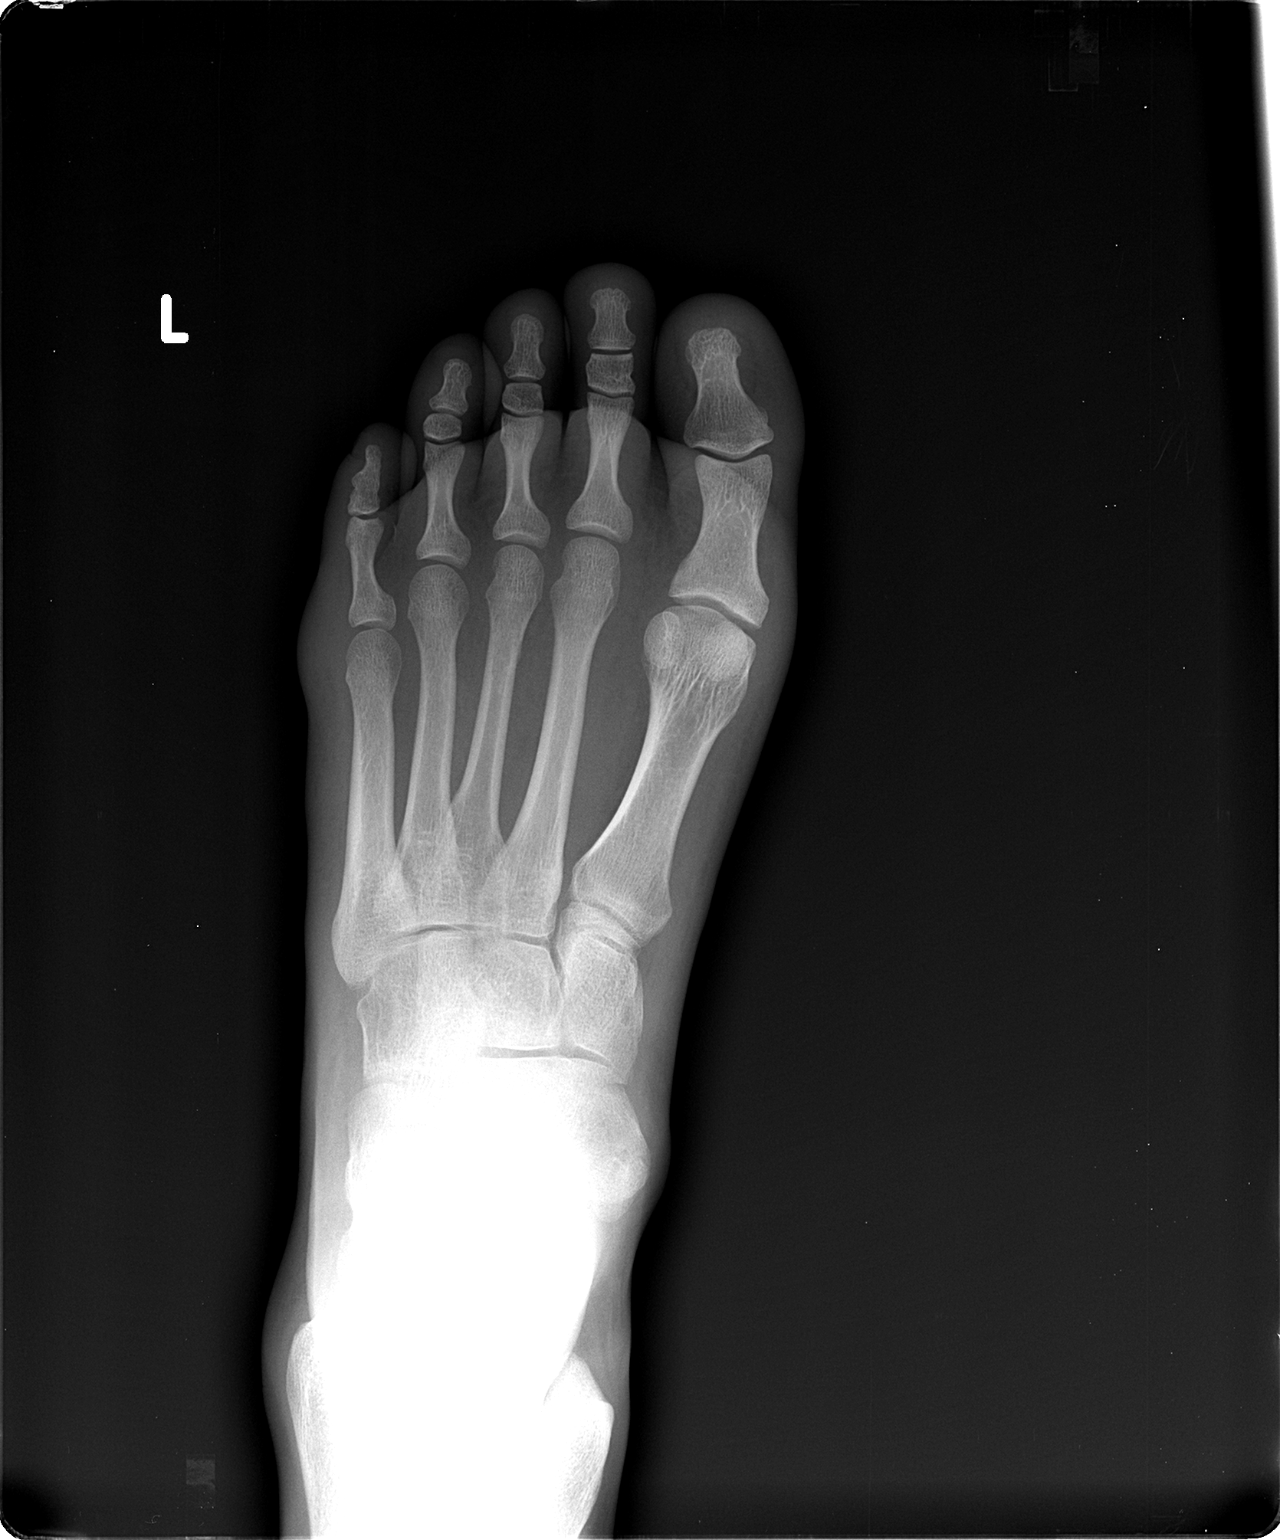

[view not recorded (2 of 2)]
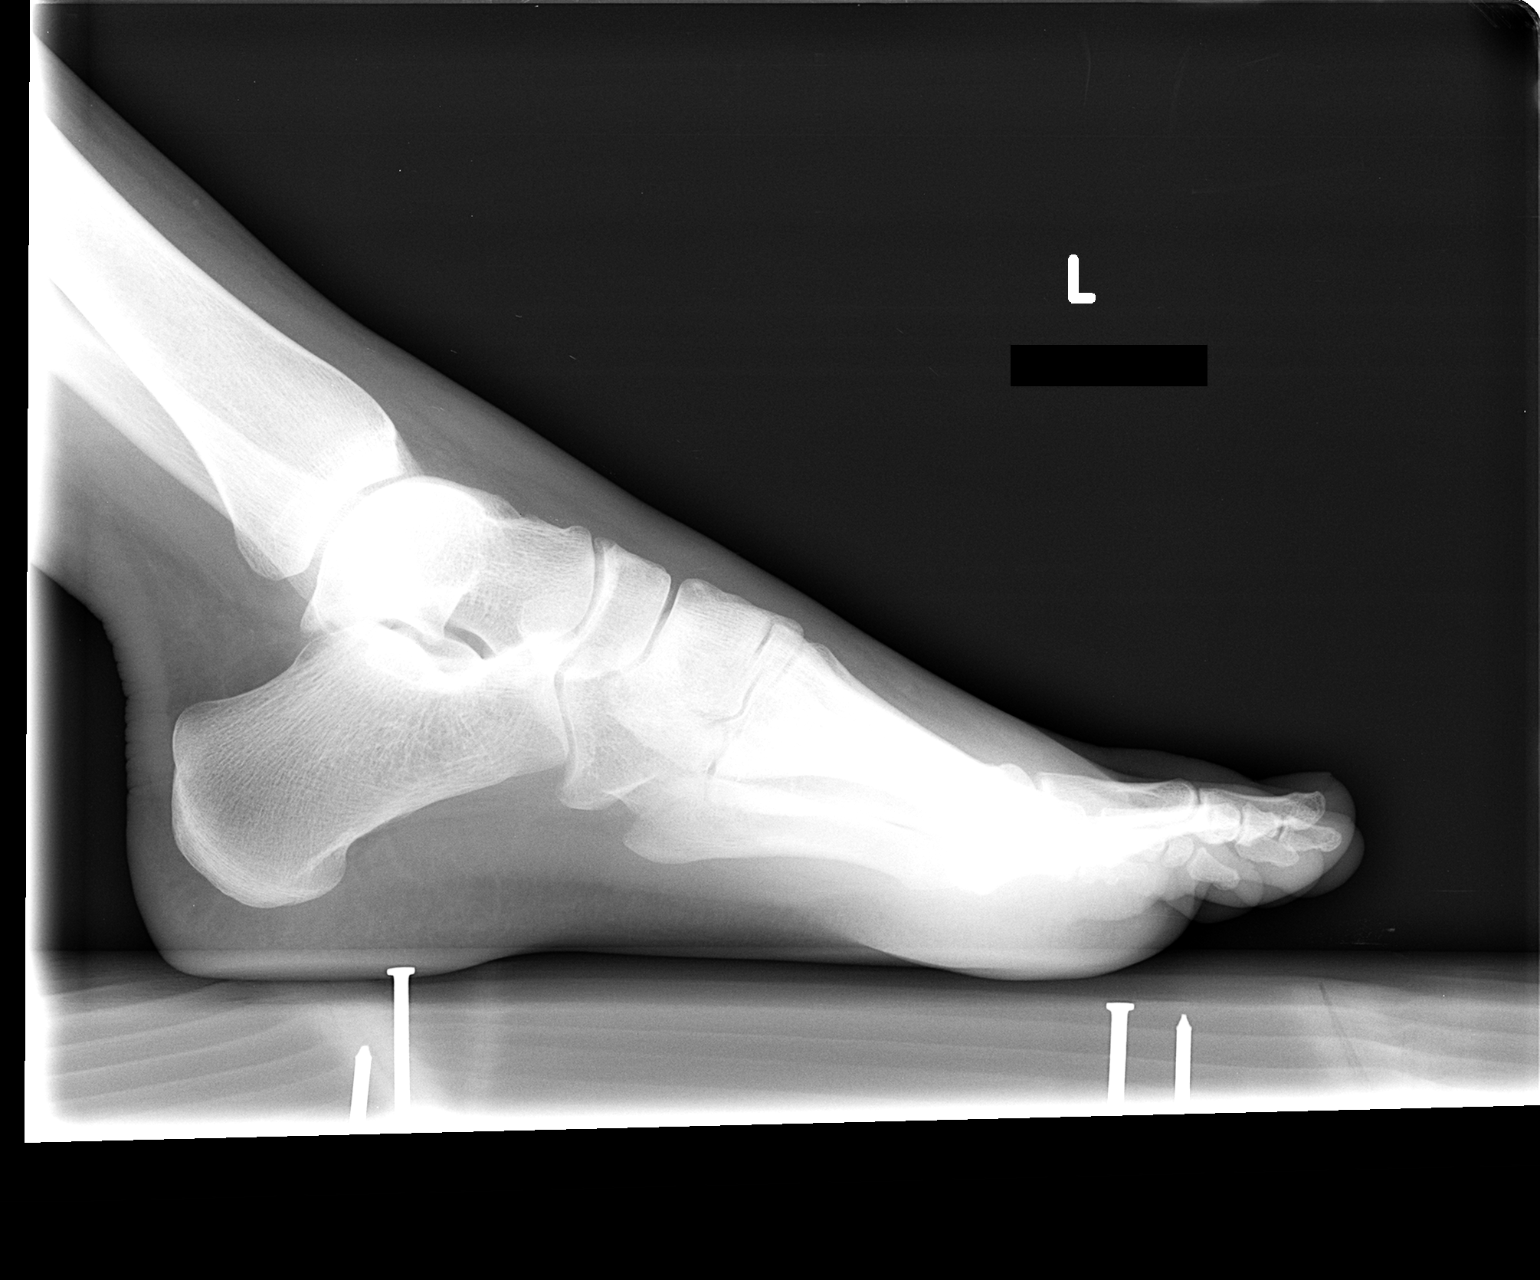

[2 of 2 positions shown; findings below may reference images not displayed]

FINDINGS: Attempted weight bearing shows no interval malalignment. No evidence
of fracture. Tiny linear high-density in the most lateral interspace
is likely on the skin surface.
IMPRESSION: Attempted weight-bearing shows no evidence of malalignment.

## 2015-06-27 SURGERY — SALPINGECTOMY, BILATERAL, LAPAROSCOPIC
Anesthesia: General | Laterality: Bilateral

## 2015-06-27 MED ORDER — BUPIVACAINE HCL (PF) 0.5 % IJ SOLN
INTRAMUSCULAR | Status: AC
  Filled 2015-06-27: qty 30

## 2015-06-27 MED ORDER — MIDAZOLAM HCL 2 MG/2ML IJ SOLN
INTRAMUSCULAR | Status: AC
  Filled 2015-06-27: qty 2

## 2015-06-27 MED ORDER — ROCURONIUM BROMIDE 100 MG/10ML IV SOLN
INTRAVENOUS | Status: DC | PRN
  Administered 2015-06-27: 5 mg via INTRAVENOUS
  Administered 2015-06-27: 25 mg via INTRAVENOUS

## 2015-06-27 MED ORDER — FENTANYL CITRATE (PF) 100 MCG/2ML IJ SOLN
INTRAMUSCULAR | Status: DC | PRN
  Administered 2015-06-27 (×2): 50 ug via INTRAVENOUS

## 2015-06-27 MED ORDER — GLYCOPYRROLATE 0.2 MG/ML IJ SOLN
INTRAMUSCULAR | Status: DC | PRN
  Administered 2015-06-27: 0.6 mg via INTRAVENOUS

## 2015-06-27 MED ORDER — LIDOCAINE HCL (PF) 1 % IJ SOLN
INTRAMUSCULAR | Status: AC
  Filled 2015-06-27: qty 5

## 2015-06-27 MED ORDER — FENTANYL CITRATE (PF) 100 MCG/2ML IJ SOLN
INTRAMUSCULAR | Status: AC
  Filled 2015-06-27: qty 2

## 2015-06-27 MED ORDER — LIDOCAINE HCL 1 % IJ SOLN
INTRAMUSCULAR | Status: DC | PRN
  Administered 2015-06-27: 25 mg via INTRADERMAL

## 2015-06-27 MED ORDER — ONDANSETRON HCL 4 MG/2ML IJ SOLN
INTRAMUSCULAR | Status: AC
  Filled 2015-06-27: qty 2

## 2015-06-27 MED ORDER — GLYCOPYRROLATE 0.2 MG/ML IJ SOLN
INTRAMUSCULAR | Status: AC
  Filled 2015-06-27: qty 3

## 2015-06-27 MED ORDER — PROPOFOL 10 MG/ML IV BOLUS
INTRAVENOUS | Status: AC
  Filled 2015-06-27: qty 20

## 2015-06-27 MED ORDER — OXYCODONE-ACETAMINOPHEN 5-325 MG PO TABS: 1.0000 | ORAL_TABLET | ORAL | Status: DC | PRN

## 2015-06-27 MED ORDER — ONDANSETRON HCL 4 MG/2ML IJ SOLN
4.0000 mg | Freq: Once | INTRAMUSCULAR | Status: AC | PRN
  Administered 2015-06-27: 4 mg via INTRAVENOUS
  Filled 2015-06-27: qty 2

## 2015-06-27 MED ORDER — MIDAZOLAM HCL 2 MG/2ML IJ SOLN
1.0000 mg | INTRAMUSCULAR | Status: DC | PRN
  Administered 2015-06-27 (×2): 2 mg via INTRAVENOUS

## 2015-06-27 MED ORDER — ROCURONIUM BROMIDE 50 MG/5ML IV SOLN
INTRAVENOUS | Status: AC
  Filled 2015-06-27: qty 1

## 2015-06-27 MED ORDER — PROPOFOL 10 MG/ML IV BOLUS
INTRAVENOUS | Status: DC | PRN
  Administered 2015-06-27: 130 mg via INTRAVENOUS

## 2015-06-27 MED ORDER — FENTANYL CITRATE (PF) 250 MCG/5ML IJ SOLN
INTRAMUSCULAR | Status: AC
Start: 2015-06-27 — End: 2015-06-27
  Filled 2015-06-27: qty 5

## 2015-06-27 MED ORDER — NEOSTIGMINE METHYLSULFATE 10 MG/10ML IV SOLN
INTRAVENOUS | Status: DC | PRN
  Administered 2015-06-27: 2 mg via INTRAVENOUS
  Administered 2015-06-27: 1 mg via INTRAVENOUS
  Administered 2015-06-27: 2 mg via INTRAVENOUS

## 2015-06-27 MED ORDER — MIDAZOLAM HCL 5 MG/5ML IJ SOLN
INTRAMUSCULAR | Status: DC | PRN
  Administered 2015-06-27: 2 mg via INTRAVENOUS

## 2015-06-27 MED ORDER — LACTATED RINGERS IV SOLN
INTRAVENOUS | Status: DC
  Administered 2015-06-27: 1000 mL via INTRAVENOUS
  Administered 2015-06-27: 12:00:00 via INTRAVENOUS

## 2015-06-27 MED ORDER — ONDANSETRON HCL 4 MG/2ML IJ SOLN
4.0000 mg | Freq: Once | INTRAMUSCULAR | Status: AC
  Administered 2015-06-27: 4 mg via INTRAVENOUS

## 2015-06-27 MED ORDER — 0.9 % SODIUM CHLORIDE (POUR BTL) OPTIME
TOPICAL | Status: DC | PRN
  Administered 2015-06-27: 1000 mL

## 2015-06-27 MED ORDER — FENTANYL CITRATE (PF) 100 MCG/2ML IJ SOLN
25.0000 ug | INTRAMUSCULAR | Status: DC | PRN
  Administered 2015-06-27 (×2): 50 ug via INTRAVENOUS
  Filled 2015-06-27: qty 2

## 2015-06-27 SURGICAL SUPPLY — 42 items
BAG HAMPER (MISCELLANEOUS) ×3 IMPLANT
BANDAGE STRIP 1X3 FLEXIBLE (GAUZE/BANDAGES/DRESSINGS) ×9 IMPLANT
BLADE SURG SZ11 CARB STEEL (BLADE) ×3 IMPLANT
CATH ROBINSON RED A/P 16FR (CATHETERS) IMPLANT
CLOSURE STERI-STRIP 1/4X4 (GAUZE/BANDAGES/DRESSINGS) ×3 IMPLANT
CLOSURE WOUND 1/4 X3 (GAUZE/BANDAGES/DRESSINGS) ×1
CLOTH BEACON ORANGE TIMEOUT ST (SAFETY) ×3 IMPLANT
COVER LIGHT HANDLE STERIS (MISCELLANEOUS) ×6 IMPLANT
DECANTER SPIKE VIAL GLASS SM (MISCELLANEOUS) ×3 IMPLANT
DURAPREP 26ML APPLICATOR (WOUND CARE) ×3 IMPLANT
ELECT REM PT RETURN 9FT ADLT (ELECTROSURGICAL) ×3
ELECTRODE REM PT RTRN 9FT ADLT (ELECTROSURGICAL) ×1 IMPLANT
FORMALIN 10 PREFIL 120ML (MISCELLANEOUS) ×3 IMPLANT
GLOVE BIOGEL PI IND STRL 7.0 (GLOVE) ×3 IMPLANT
GLOVE BIOGEL PI IND STRL 7.5 (GLOVE) ×1 IMPLANT
GLOVE BIOGEL PI IND STRL 9 (GLOVE) ×1 IMPLANT
GLOVE BIOGEL PI INDICATOR 7.0 (GLOVE) ×6
GLOVE BIOGEL PI INDICATOR 7.5 (GLOVE) ×2
GLOVE BIOGEL PI INDICATOR 9 (GLOVE) ×2
GLOVE ECLIPSE 6.5 STRL STRAW (GLOVE) ×3 IMPLANT
GLOVE ECLIPSE 7.0 STRL STRAW (GLOVE) ×3 IMPLANT
GLOVE ECLIPSE 9.0 STRL (GLOVE) ×6 IMPLANT
GOWN SPEC L3 XXLG W/TWL (GOWN DISPOSABLE) ×3 IMPLANT
GOWN STRL REUS W/TWL LRG LVL3 (GOWN DISPOSABLE) ×3 IMPLANT
INST SET LAPROSCOPIC GYN AP (KITS) ×3 IMPLANT
KIT ROOM TURNOVER APOR (KITS) ×3 IMPLANT
NEEDLE INSUFFLATION 120MM (ENDOMECHANICALS) ×3 IMPLANT
NS IRRIG 1000ML POUR BTL (IV SOLUTION) ×3 IMPLANT
PACK PERI GYN (CUSTOM PROCEDURE TRAY) ×3 IMPLANT
PAD ARMBOARD 7.5X6 YLW CONV (MISCELLANEOUS) ×3 IMPLANT
SET BASIN LINEN APH (SET/KITS/TRAYS/PACK) ×3 IMPLANT
SET IRRIG TUBING LAPAROSCOPIC (IRRIGATION / IRRIGATOR) IMPLANT
SHEARS HARMONIC ACE PLUS 36CM (ENDOMECHANICALS) ×3 IMPLANT
SLEEVE ENDOPATH XCEL 5M (ENDOMECHANICALS) ×6 IMPLANT
SOLUTION ANTI FOG 6CC (MISCELLANEOUS) ×3 IMPLANT
STRIP CLOSURE SKIN 1/4X3 (GAUZE/BANDAGES/DRESSINGS) ×2 IMPLANT
SUT VIC AB 4-0 PS2 27 (SUTURE) ×3 IMPLANT
SYR BULB IRRIGATION 50ML (SYRINGE) ×3 IMPLANT
SYRINGE 10CC LL (SYRINGE) ×3 IMPLANT
TROCAR XCEL NON-BLD 5MMX100MML (ENDOMECHANICALS) ×3 IMPLANT
TUBING INSUFFLATION (TUBING) ×3 IMPLANT
WARMER LAPAROSCOPE (MISCELLANEOUS) ×3 IMPLANT

## 2015-06-27 NOTE — Anesthesia Procedure Notes (Signed)
Procedure Name: Intubation Date/Time: 06/27/2015 10:48 AM Performed by: Despina Hidden Pre-anesthesia Checklist: Patient identified, Emergency Drugs available, Suction available and Patient being monitored Patient Re-evaluated:Patient Re-evaluated prior to inductionOxygen Delivery Method: Circle system utilized Preoxygenation: Pre-oxygenation with 100% oxygen Intubation Type: IV induction Ventilation: Mask ventilation without difficulty and Oral airway inserted - appropriate to patient size Laryngoscope Size: Mac and 3 Grade View: Grade I Tube type: Oral Tube size: 7.0 mm Number of attempts: 1 Airway Equipment and Method: Stylet and Oral airway Placement Confirmation: ETT inserted through vocal cords under direct vision,  positive ETCO2 and breath sounds checked- equal and bilateral Secured at: 22 cm Tube secured with: Tape Dental Injury: Teeth and Oropharynx as per pre-operative assessment

## 2015-06-27 NOTE — Op Note (Signed)
06/27/2015  11:39 AM  PATIENT:  Brandy Hickman  25 y.o. female  PRE-OPERATIVE DIAGNOSIS:  Permanent Sterilization  POST-OPERATIVE DIAGNOSIS:  Permanent Sterilization  PROCEDURE:  Procedure(s): LAPAROSCOPIC BILATERAL SALPINGECTOMY (Bilateral)  SURGEON:  Surgeon(s) and Role:    * Tilda Burrow, MD - Primary  PHYSICIAN ASSISTANT:   ASSISTANTS: Laurena Bering, CST   ANESTHESIA:   general  EBL:  Total I/O In: 1000 [I.V.:1000] Out: -   BLOOD ADMINISTERED:none  DRAINS: none   LOCAL MEDICATIONS USED:  NONE  SPECIMEN:  Source of Specimen:  fallopian tubes  DISPOSITION OF SPECIMEN:  PATHOLOGY  COUNTS:  YES  TOURNIQUET:  * No tourniquets in log *  DICTATION: .Dragon Dictation  PLAN OF CARE: Discharge to home after PACU  PATIENT DISPOSITION:  PACU - hemodynamically stable.   Delay start of Pharmacological VTE agent (>24hrs) due to surgical blood loss or risk of bleeding: not applicable Details of procedure: Patient was taken to the operating room prepped and draped for abdominal procedure, with cervix grasped with single-tooth tenaculum for uterine manipulation. Attention was directed to the umbilicus where a 5 mm  incision was made in sagittal axis infraumbilical location and a transverse suprapubic and right lower quadrant incisions of similar length. Abdomen was palpated and sacral promontory appreciated. Particular care was taken to orient the Veress needle toward the pelvis and elevate the abdominal wall while inserting the Veress needle the peritoneum achieved under 8 mmHg pressure using 3 L of CO2. A 5 mm camera was inserted through the umbilicus directly visualized in the abdominal cavity with no suspicion of injury to internal organs. Suprapubic and right lower quadrant trochars were inserted under direct visualization, and attention directed to the pelvis. The uterus could be identified in its anteflexed position, elevated sufficiently that the fallopian tube on the  right could be identified to its fimbriated end. The ovary was normal. The fallopian tube was amputated off of its mesosalpinx using Harmonic Ace 7 in normal fashion with removal of the entire tube through the suprapubic site. The left fallopian tube was removed in similar fashion beginning proximally and extending laterally. Specimens were taken out and sent for histology. Saline was instilled in the abdomen 1 20 cc, then laparoscopic equipment removed, the abdomen carefully deflated to remove as much carbon dioxide gas as possible, and then the laparoscopic instruments removed and subcuticular 4-0 Vicryl used to close all 3 incisions. Steri-Strips and Band-Aids were applied and patient recovery room in stable condition.

## 2015-06-27 NOTE — Transfer of Care (Signed)
Immediate Anesthesia Transfer of Care Note  Patient: Brandy Hickman  Procedure(s) Performed: Procedure(s): LAPAROSCOPIC BILATERAL SALPINGECTOMY (Bilateral)  Patient Location: PACU  Anesthesia Type:General  Level of Consciousness: awake and patient cooperative  Airway & Oxygen Therapy: Patient Spontanous Breathing and Patient connected to face mask oxygen  Post-op Assessment: Report given to RN, Post -op Vital signs reviewed and stable and Patient moving all extremities  Post vital signs: Reviewed and stable  Last Vitals:  Filed Vitals:   06/27/15 1030 06/27/15 1035  BP: 99/57 102/57  Temp:    Resp: 10 9    Complications: No apparent anesthesia complications

## 2015-06-27 NOTE — Anesthesia Preprocedure Evaluation (Signed)
Anesthesia Evaluation  Patient identified by MRN, date of birth, ID band Patient awake    Reviewed: Allergy & Precautions, H&P , NPO status , Patient's Chart, lab work & pertinent test results  Airway Mallampati: I  TM Distance: >3 FB Neck ROM: full    Dental no notable dental hx.    Pulmonary Current Smoker,    Pulmonary exam normal        Cardiovascular hypertension (PIH), Normal cardiovascular exam     Neuro/Psych PSYCHIATRIC DISORDERS Bipolar Disorder negative neurological ROS     GI/Hepatic negative GI ROS, (+)     substance abuse (opiates pos)  marijuana use,   Endo/Other    Renal/GU      Musculoskeletal   Abdominal   Peds  Hematology   Anesthesia Other Findings   Reproductive/Obstetrics                             Anesthesia Physical Anesthesia Plan  ASA: II  Anesthesia Plan: General   Post-op Pain Management:    Induction: Intravenous  Airway Management Planned: Oral ETT  Additional Equipment:   Intra-op Plan:   Post-operative Plan: Extubation in OR  Informed Consent: I have reviewed the patients History and Physical, chart, labs and discussed the procedure including the risks, benefits and alternatives for the proposed anesthesia with the patient or authorized representative who has indicated his/her understanding and acceptance.     Plan Discussed with:   Anesthesia Plan Comments:         Anesthesia Quick Evaluation

## 2015-06-27 NOTE — Interval H&P Note (Signed)
History and Physical Interval Note:  06/27/2015 10:14 AM  Brandy Hickman  has presented today for surgery, with the diagnosis of STERLIZATION  The various methods of treatment have been discussed with the patient and family. After consideration of risks, benefits and other options for treatment, the patient has consented to  Procedure(s): LAPAROSCOPIC BILATERAL SALPINGECTOMY (Bilateral) as a surgical intervention .  The patient's history has been reviewed, patient examined, no change in status, stable for surgery.  I have reviewed the patient's chart and labs.  Questions were answered to the patient's satisfaction.     Tilda Burrow

## 2015-06-27 NOTE — Brief Op Note (Signed)
06/27/2015  11:39 AM  PATIENT:  Brandy Hickman  25 y.o. female  PRE-OPERATIVE DIAGNOSIS:  Permanent Sterilization  POST-OPERATIVE DIAGNOSIS:  Permanent Sterilization  PROCEDURE:  Procedure(s): LAPAROSCOPIC BILATERAL SALPINGECTOMY (Bilateral)  SURGEON:  Surgeon(s) and Role:    * Tilda Burrow, MD - Primary  PHYSICIAN ASSISTANT:   ASSISTANTS: Laurena Bering, CST   ANESTHESIA:   general  EBL:  Total I/O In: 1000 [I.V.:1000] Out: -   BLOOD ADMINISTERED:none  DRAINS: none   LOCAL MEDICATIONS USED:  NONE  SPECIMEN:  Source of Specimen:  fallopian tubes  DISPOSITION OF SPECIMEN:  PATHOLOGY  COUNTS:  YES  TOURNIQUET:  * No tourniquets in log *  DICTATION: .Dragon Dictation  PLAN OF CARE: Discharge to home after PACU  PATIENT DISPOSITION:  PACU - hemodynamically stable.   Delay start of Pharmacological VTE agent (>24hrs) due to surgical blood loss or risk of bleeding: not applicable

## 2015-06-27 NOTE — Anesthesia Postprocedure Evaluation (Signed)
Anesthesia Post Note  Patient: Brandy Hickman  Procedure(s) Performed: Procedure(s) (LRB): LAPAROSCOPIC BILATERAL SALPINGECTOMY (Bilateral)  Patient location during evaluation: PACU Anesthesia Type: General Level of consciousness: awake and alert, oriented and patient cooperative Pain management: pain level controlled Vital Signs Assessment: post-procedure vital signs reviewed and stable Respiratory status: nonlabored ventilation and patient connected to face mask oxygen Cardiovascular status: blood pressure returned to baseline Postop Assessment: no signs of nausea or vomiting Anesthetic complications: no    Last Vitals:  Filed Vitals:   06/27/15 1035 06/27/15 1130  BP: 102/57   Temp:  36.5 C  Resp: 9     Last Pain: There were no vitals filed for this visit.               Raihan Kimmel J

## 2015-06-27 NOTE — H&P (View-Only) (Signed)
  Tilda Burrow, MD at 05/25/2015 3:21 PM     Status: Signed       Expand All Collapse All   Patient ID: Brandy Hickman, female DOB: 1990-08-06, 25 y.o. MRN: [id number]  Subjective:  Brandy Hickman is a 25 y.o. female who presents for a 4 weeks postpartum visit  Patient concerns: Pt would like to discuss tubal ligation. She reports she feels mentally and physically well s/p delivery. She notes that she is breastfeeding. She also complains of 3 days of bleeding 4 days after delivery accompanied by HA with photophobia.  She has requested permanent sterilization, and we have reviewed the procedure, Laparoscopic bilateral salpingectomy,  AND  PT acknowledges the intended permanency of requested procedure, and the potential failure , and potential operative risks. [employer] instruction booklets used as Education administrator.  Prenatal and intrapartum course notable for normal vaginal delivery  Patient is not sexually active.   The following portions of the patient's history were reviewed and updated as appropriate: allergies, current medications, past family history, past medical history, past surgical history and problem list.  Review of Systems See Subjective, otherwise negative ROS.  Objective:  BP 100/50 mmHg  Ht  (1.575 m)  Wt 118 lb (53.524 kg)  BMI 21.58 kg/m2  Breastfeeding? Yes  General:  alert, cooperative and no distress     Lungs: clear to auscultation bilaterally  Heart:  regular rate and rhythm, S1, S2 normal, no murmur  Abdomen: soft, non-tender; bowel sounds normal; no masses, no organomegaly  Vulva: normal  Vagina: normal vagina; minimal tissue thinning   Cervix:  normal  Corpus: normal size, contour, position, consistency, mobility, non-tender  Adnexa:  normal adnexa        Assessment:  1. postpartum exam.  2. Contraception: tubal ligation discussed and agreed to perform. 3. Excellent postpartum state  Plan:   Schedule tubal ligation , Laparoscopic bilateral salpingectomy, on 28 Feb 17.  By signing my name below, I, Doreatha Martin, attest that this documentation has been prepared under the direction and in the presence of Tilda Burrow, MD. Electronically Signed: Doreatha Martin, ED Scribe. 05/25/2015. 3:21 PM.  I personally performed the services described in this documentation, which was SCRIBED in my presence. The recorded information has been reviewed and considered accurate. It has been edited as necessary during review. Tilda Burrow, MD

## 2015-06-27 NOTE — Discharge Instructions (Signed)
Salpingectomy, Care After Refer to this sheet in the next few weeks. These instructions provide you with information on caring for yourself after your procedure. Your health care provider may also give you more specific instructions. Your treatment has been planned according to current medical practices, but problems sometimes occur. Call your health care provider if you have any problems or questions after your procedure. WHAT TO EXPECT AFTER THE PROCEDURE After your procedure, it is typical to have the following:  Abdominal pain that can be controlled with pain medicine.  Vaginal spotting.  Tiredness. HOME CARE INSTRUCTIONS  Get plenty of rest and sleep.  Only take over-the-counter or prescription medicines as directed by your health care provider.  Keep incision areas clean and dry. Remove or change any bandages (dressings) only as directed by your health care provider.  You may resume your regular diet. Eat a well-balanced diet.  Drink enough fluids to keep your urine clear or pale yellow.  Limit exercise and activities as directed by your health care provider. Do not lift anything heavier than 5 lb (2.3 kg) until your health care provider approves.  Do not drive until your health care provider approves.  Do not have sexual intercourse until your health care provider says it is okay.  Take your temperature twice a day for the first week. Write those temperatures down.  Follow up with your health care provider as directed. SEEK MEDICAL CARE IF:  You have pain when you urinate.  You see pus coming out of the incision, or the incision is separating.  You have increasing abdominal pain.  You have swelling or redness in the incision area.  You develop a rash.  You feel lightheaded.  You have pain that is not controlled with medicine. SEEK IMMEDIATE MEDICAL CARE IF:  You develop a fever.  You have increasing abdominal pain.  You develop chest or leg pain.  You  develop shortness of breath.  You pass out.   This information is not intended to replace advice given to you by your health care provider. Make sure you discuss any questions you have with your health care provider.   Document Released: 07/20/2010 Document Revised: 05/06/2014 Document Reviewed: 10/07/2012 Elsevier Interactive Patient Education 2016 [employer] Laparoscopic Tubal Ligation, Care After Refer to this sheet in the next few weeks. These instructions provide you with information about caring for yourself after your procedure. Your health care provider may also give you more specific instructions. Your treatment has been planned according to current medical practices, but problems sometimes occur. Call your health care provider if you have any problems or questions after your procedure. WHAT TO EXPECT AFTER THE PROCEDURE After your procedure, it is common to have:  Sore throat.  Soreness at the incision site.  Mild cramping.  Tiredness.  Mild nausea or vomiting.  Shoulder pain. HOME CARE INSTRUCTIONS  Rest for the remainder of the day.  Take medicines only as directed by your health care provider. These include over-the-counter medicines and prescription medicines. Do not take aspirin, which can cause bleeding.  Over the next few days, gradually return to your normal activities and your normal diet.  Avoid sexual intercourse for 2 weeks or as directed by your health care provider.  Do not use tampons, and do not douche.  Do not drive or operate heavy machinery while taking pain medicine.  Do not lift anything that is heavier than 5 lb (2.3 kg) for 2 weeks or as directed by your health care  provider.  Do not take baths. Take showers only. Ask your health care provider when you can start taking baths.  Take your temperature twice each day and write it down.  Try to have help for your household needs for the first 7-10 days.  There are many different ways to  close and cover an incision, including stitches (sutures), skin glue, and adhesive strips. Follow instructions from your health care provider about:  Incision care.  Bandage (dressing) changes and removal.  Incision closure removal.  Check your incision area every day for signs of infection. Watch for:  Redness, swelling, or pain.  Fluid, blood, or pus.  Keep all follow-up visits as directed by your health care provider. SEEK MEDICAL CARE IF:  You have redness, swelling, or increasing pain in your incision area.  You have fluid, blood, or pus coming from your incision for longer than 1 day.  You notice a bad smell coming from your incision or your dressing.  The edges of your incision break open after the sutures have been removed.  Your pain does not decrease after 2-3 days.  You have a rash.  You repeatedly become dizzy or light-headed.  You have a reaction to your medicine.  Your pain medicine is not helping.  You are constipated. SEEK IMMEDIATE MEDICAL CARE IF:  You have a fever.  You faint.  You have increasing pain in your abdomen.  You have severe pain in one or both of your shoulders.  You have bleeding or drainage from your suture sites or your vagina after surgery.  You have shortness of breath or have difficulty breathing.  You have chest pain or leg pain.  You have ongoing nausea, vomiting, or diarrhea.   This information is not intended to replace advice given to you by your health care provider. Make sure you discuss any questions you have with your health care provider.   Document Released: 11/02/2004 Document Revised: 08/30/2014 Document Reviewed: 07/27/2011 Elsevier Interactive Patient Education [employer]

## 2015-06-28 ENCOUNTER — Encounter (HOSPITAL_COMMUNITY): Payer: Self-pay | Admitting: Obstetrics and Gynecology

## 2015-07-04 ENCOUNTER — Encounter: Payer: Medicaid Other | Admitting: Obstetrics and Gynecology

## 2016-08-19 ENCOUNTER — Emergency Department (HOSPITAL_COMMUNITY)
Admission: EM | Admit: 2016-08-19 | Discharge: 2016-08-19 | Disposition: A | Discharge status: Home / Self Care | Payer: Medicaid Other | Attending: Emergency Medicine | Admitting: Emergency Medicine

## 2016-08-19 ENCOUNTER — Encounter (HOSPITAL_COMMUNITY): Payer: Self-pay | Admitting: Adult Health

## 2016-08-19 DIAGNOSIS — K0889 Other specified disorders of teeth and supporting structures: Secondary | ICD-10-CM

## 2016-08-19 DIAGNOSIS — F17210 Nicotine dependence, cigarettes, uncomplicated: Secondary | ICD-10-CM | POA: Insufficient documentation

## 2016-08-19 DIAGNOSIS — K047 Periapical abscess without sinus: Secondary | ICD-10-CM

## 2016-08-19 MED ORDER — IBUPROFEN 600 MG PO TABS: 600.0000 mg | ORAL_TABLET | Freq: Four times a day (QID) | ORAL | 0 refills | Status: DC | PRN

## 2016-08-19 MED ORDER — OXYCODONE-ACETAMINOPHEN 5-325 MG PO TABS: 1.0000 | ORAL_TABLET | Freq: Four times a day (QID) | ORAL | 0 refills | Status: DC | PRN

## 2016-08-19 MED ORDER — CLINDAMYCIN HCL 150 MG PO CAPS
300.0000 mg | ORAL_CAPSULE | Freq: Once | ORAL | Status: AC
  Administered 2016-08-19: 300 mg via ORAL
  Filled 2016-08-19: qty 2

## 2016-08-19 MED ORDER — MORPHINE SULFATE (PF) 4 MG/ML IV SOLN
4.0000 mg | Freq: Once | INTRAVENOUS | Status: AC
  Administered 2016-08-19: 4 mg via INTRAMUSCULAR
  Filled 2016-08-19: qty 1

## 2016-08-19 MED ORDER — BUPIVACAINE HCL (PF) 0.25 % IJ SOLN
10.0000 mL | Freq: Once | INTRAMUSCULAR | Status: AC
  Administered 2016-08-19: 10 mL
  Filled 2016-08-19: qty 30

## 2016-08-19 MED ORDER — CLINDAMYCIN HCL 300 MG PO CAPS: 300.0000 mg | ORAL_CAPSULE | Freq: Three times a day (TID) | ORAL | 0 refills | Status: AC

## 2016-08-19 NOTE — Discharge Instructions (Signed)
Please read and follow all provided instructions.  Your diagnoses today include:  1. Pain, dental   2. Dental abscess    Tests performed today include: Vital signs. See below for your results today.   Medications prescribed:  Take as prescribed   Home care instructions:  Follow any educational materials contained in this packet.  Follow-up instructions: Please follow-up with your Dentist for further evaluation of symptoms and treatment   Return instructions:  Please return to the Emergency Department if you do not get better, if you get worse, or new symptoms OR  - Fever (temperature greater than 101.26F)  - Bleeding that does not stop with holding pressure to the area    -Severe pain (please note that you may be more sore the day after your accident)  - Chest Pain  - Difficulty breathing  - Severe nausea or vomiting  - Inability to tolerate food and liquids  - Passing out  - Skin becoming red around your wounds  - Change in mental status (confusion or lethargy)  - New numbness or weakness    Please return if you have any other emergent concerns.  Additional Information:  Your vital signs today were: BP 111/71    Pulse 85    Temp 97.5 F (36.4 C) (Oral)    Resp 20    LMP 07/19/2016 (Approximate)    SpO2 99%  If your blood pressure (BP) was elevated above 135/85 this visit, please have this repeated by your doctor within one month. ---------------

## 2016-08-19 NOTE — ED Triage Notes (Signed)
Presents with left sided oral swelling began yesterday pani started this weekend. She called her dentist and was sent here.

## 2016-08-19 NOTE — ED Provider Notes (Signed)
[facility]-EMERGENCY DEPT Provider Note   CSN: [id number] Arrival date & time: 08/19/16  1342  By signing my name below, I, Brandy Hickman, attest that this documentation has been prepared under the direction and in the presence of Brandy Pili, PA-C. Electronically Signed: Cynda Hickman, Scribe. 08/19/16. 2:03 PM.  History   Chief Complaint Chief Complaint  Patient presents with  . Abscess   HPI Comments: Brandy Hickman is a 26 y.o. female with no pertinent medical history, who presents to the Emergency Department complaining of sudden-onset, gradually worsening dental pain that began yesterday. Patient states she has very poor dentition, she is supposed to get multiple extractions of her teeth. Patient reports excruciating left lower dental pain. Patient states she called her dentist yesterday, in which she is unable to make an appointment until Thursday. Patient was advised to come here if her pain worsens. Patient reports associated left-sided facial swelling. Patient reports applying warm compresses and talking ibuprofen (600 mg at 9:30 AM) with no relief. Patient is a current everyday smoker. Patient denies any fever, chills, cough, sore throat, trouble swallowing, ear pain, or joint pain.   The history is provided by the patient. No language interpreter was used.    Past Medical History:  Diagnosis Date  . Gestational hypertension   . Hx of preeclampsia, prior pregnancy, currently pregnant   . Manic-depressive disorder [facility])     Patient Active Problem List   Diagnosis Date Noted  . Encounter for female sterilization procedure  bilateral salpingectomy 06/27/2015  . Postpartum care following vaginal delivery 05/25/2015  . Contraception management 05/25/2015    Past Surgical History:  Procedure Laterality Date  . LAPAROSCOPIC BILATERAL SALPINGECTOMY Bilateral 06/27/2015   Procedure: LAPAROSCOPIC BILATERAL SALPINGECTOMY;  Surgeon: Tilda Burrow, MD;  Location: [facility] ORS;  Service:  Gynecology;  Laterality: Bilateral;  . TONSILLECTOMY      OB History    Gravida Para Term Preterm AB Living   0 4   SAB TAB Ectopic Multiple Live Births   0 0 0 0 5       Home Medications    Prior to Admission medications   Medication Sig Start Date End Date Taking? Authorizing Provider  acetaminophen (TYLENOL) 500 MG tablet Take 1,000 mg by mouth every 6 (six) hours as needed for mild pain or headache.    Historical Provider, MD  citalopram (CELEXA) 40 MG tablet Take 1 tablet (40 mg total) by mouth daily. 04/30/15   Tilda Burrow, MD  ibuprofen (ADVIL,MOTRIN) 200 MG tablet Take 600 mg by mouth every 6 (six) hours as needed for moderate pain.    Historical Provider, MD  oxyCODONE-acetaminophen (PERCOCET/ROXICET) 5-325 MG tablet Take 1 tablet by mouth every 4 (four) hours as needed. 06/27/15   Tilda Burrow, MD  Prenatal Vit-Fe Fumarate-FA (PRENATAL MULTIVITAMIN) TABS tablet Take 1 tablet by mouth daily at 12 noon.    Historical Provider, MD    Family History Family History  Problem Relation Age of Onset  . Clotting disorder Mother   . Spina bifida Sister   . Other Brother     Born at 26 weeks has a flute shunt in his brain  . Depression Brother   . Heart failure Maternal Grandmother   . Varicose Veins Maternal Grandmother   . Stroke Maternal Grandfather     Social History Social History  Substance Use Topics  . Smoking status: Current Every Day Smoker    Packs/day: 0.50    Years:  13.00    Types: Cigarettes  . Smokeless tobacco: Never Used  . Alcohol use No     Allergies   Flexeril [cyclobenzaprine] and Hydrocodone   Review of Systems Review of Systems  Constitutional: Negative for chills and fever.  HENT: Positive for dental problem. Negative for ear pain, sore throat and trouble swallowing.   Respiratory: Negative for cough.   Musculoskeletal: Negative for neck pain.     Physical Exam Updated Vital Signs BP 111/71   Pulse 85   Temp 97.5 F  (36.4 C) (Oral)   Resp 20   LMP 07/19/2016 (Approximate)   SpO2 99%   Physical Exam  Constitutional: She is oriented to person, place, and time. She appears well-developed.  HENT:  Head: Normocephalic and atraumatic.  Mouth/Throat: Oropharynx is clear and moist.  Left posterior lower molars with poor dentition, cavities noted. Swelling around the teeth, but no abscess, induration, or purulence. No trismus. Negative for Ludwig's. Full range of motion of the neck.   Cardiovascular: Normal rate.   Pulmonary/Chest: Effort normal.  Musculoskeletal: Normal range of motion.  Neurological: She is alert and oriented to person, place, and time.  Skin: Skin is warm and dry.  Psychiatric: She has a normal mood and affect.  Nursing note and vitals reviewed.  ED Treatments / Results  DIAGNOSTIC STUDIES: Oxygen Saturation is 99% on RA, normal by my interpretation.    COORDINATION OF CARE: 2:03 PM Discussed treatment plan with pt at bedside and pt agreed to plan, which includes a dental block and clindamycin.   Labs (all labs ordered are listed, but only abnormal results are displayed) Labs Reviewed - No data to display  EKG  EKG Interpretation None       Radiology No results found.  Procedures Dental Block Date/Time: 08/19/2016 2:29 PM Performed by: Brandy Hickman Authorized by: Marily Memos   Consent:    Consent obtained:  Verbal   Consent given by:  Patient   Risks discussed:  Pain Indications:    Indications: dental pain   Location:    Block type:  Inferior alveolar   Laterality:  Left Procedure details (see MAR for exact dosages):    Needle gauge:  25 G   Anesthetic injected:  Bupivacaine 0.25% w/o epi   Injection procedure:  Anatomic landmarks identified and incremental injection Post-procedure details:    Outcome:  Pain improved   Patient tolerance of procedure:  Tolerated well, no immediate complications   (including critical care time)  Medications Ordered in  ED Medications - No data to display   Initial Impression / Assessment and Plan / ED Course  I have reviewed the triage vital signs and the nursing notes.  Pertinent labs & imaging results that were available during my care of the patient were reviewed by me and considered in my medical decision making (see chart for details).    Final Clinical Impressions(s) / ED Diagnoses   Dental pain associated with dental cary but no signs or symptoms of drainable dental abscess with patient afebrile, non toxic appearing and swallowing secretions well. Exam unconcerning for Ludwig's angina or other deep tissue infection in neck. As there is gum swelling, erythema, and facial swelling, will treat with antibiotic and pain medicine. Urged patient to follow-up with dentist. I have reviewed the [place] Controlled Substance Reporting System. Given Rx #10 Percocet.  I gave patient referral to dentist and stressed the importance of dental follow up for ultimate management of dental pain. Patient voices understanding  and is agreeable to plan.  2:35 PM  Pain has significantly improved with pain medication and dental block.   Final diagnoses:  Pain, dental  Dental abscess    New Prescriptions New Prescriptions   No medications on file   I personally performed the services described in this documentation, which was scribed in my presence. The recorded information has been reviewed and is accurate.    Brandy Pili, PA-C 08/19/16 1440    Marily Memos, MD 08/19/16 [id number]

## 2016-08-19 NOTE — ED Notes (Signed)
Pt made aware to return if symptoms worsen or if any life threatening symptoms occur.   

## 2020-04-29 ENCOUNTER — Encounter (HOSPITAL_COMMUNITY): Payer: Self-pay | Admitting: *Deleted

## 2020-04-29 ENCOUNTER — Inpatient Hospital Stay (HOSPITAL_COMMUNITY): Payer: Self-pay

## 2020-04-29 ENCOUNTER — Other Ambulatory Visit: Payer: Self-pay

## 2020-04-29 ENCOUNTER — Inpatient Hospital Stay (HOSPITAL_COMMUNITY)
Admission: EM | Admit: 2020-04-29 | Discharge: 2020-05-02 | DRG: 872 | Disposition: A | Discharge status: Home / Self Care | Payer: Self-pay | Attending: Family Medicine | Admitting: Family Medicine

## 2020-04-29 ENCOUNTER — Emergency Department (HOSPITAL_COMMUNITY): Payer: Self-pay

## 2020-04-29 DIAGNOSIS — Z885 Allergy status to narcotic agent status: Secondary | ICD-10-CM

## 2020-04-29 DIAGNOSIS — Z823 Family history of stroke: Secondary | ICD-10-CM

## 2020-04-29 DIAGNOSIS — Z888 Allergy status to other drugs, medicaments and biological substances status: Secondary | ICD-10-CM

## 2020-04-29 DIAGNOSIS — E876 Hypokalemia: Secondary | ICD-10-CM | POA: Diagnosis present

## 2020-04-29 DIAGNOSIS — F319 Bipolar disorder, unspecified: Secondary | ICD-10-CM | POA: Diagnosis present

## 2020-04-29 DIAGNOSIS — M546 Pain in thoracic spine: Secondary | ICD-10-CM | POA: Diagnosis present

## 2020-04-29 DIAGNOSIS — E871 Hypo-osmolality and hyponatremia: Secondary | ICD-10-CM | POA: Diagnosis present

## 2020-04-29 DIAGNOSIS — K5900 Constipation, unspecified: Secondary | ICD-10-CM | POA: Diagnosis present

## 2020-04-29 DIAGNOSIS — N12 Tubulo-interstitial nephritis, not specified as acute or chronic: Secondary | ICD-10-CM | POA: Diagnosis present

## 2020-04-29 DIAGNOSIS — N179 Acute kidney failure, unspecified: Secondary | ICD-10-CM | POA: Diagnosis present

## 2020-04-29 DIAGNOSIS — Z832 Family history of diseases of the blood and blood-forming organs and certain disorders involving the immune mechanism: Secondary | ICD-10-CM

## 2020-04-29 DIAGNOSIS — E860 Dehydration: Secondary | ICD-10-CM | POA: Diagnosis present

## 2020-04-29 DIAGNOSIS — N83201 Unspecified ovarian cyst, right side: Secondary | ICD-10-CM | POA: Diagnosis present

## 2020-04-29 DIAGNOSIS — Z79899 Other long term (current) drug therapy: Secondary | ICD-10-CM

## 2020-04-29 DIAGNOSIS — F17210 Nicotine dependence, cigarettes, uncomplicated: Secondary | ICD-10-CM | POA: Diagnosis present

## 2020-04-29 DIAGNOSIS — N390 Urinary tract infection, site not specified: Secondary | ICD-10-CM | POA: Diagnosis present

## 2020-04-29 DIAGNOSIS — R000 Tachycardia, unspecified: Secondary | ICD-10-CM | POA: Diagnosis present

## 2020-04-29 DIAGNOSIS — A4151 Sepsis due to Escherichia coli [E. coli]: Principal | ICD-10-CM | POA: Diagnosis present

## 2020-04-29 DIAGNOSIS — N200 Calculus of kidney: Secondary | ICD-10-CM | POA: Diagnosis present

## 2020-04-29 DIAGNOSIS — Z20822 Contact with and (suspected) exposure to covid-19: Secondary | ICD-10-CM | POA: Diagnosis present

## 2020-04-29 DIAGNOSIS — Z818 Family history of other mental and behavioral disorders: Secondary | ICD-10-CM

## 2020-04-29 DIAGNOSIS — A419 Sepsis, unspecified organism: Secondary | ICD-10-CM | POA: Diagnosis present

## 2020-04-29 DIAGNOSIS — F3162 Bipolar disorder, current episode mixed, moderate: Secondary | ICD-10-CM | POA: Diagnosis present

## 2020-04-29 DIAGNOSIS — N83209 Unspecified ovarian cyst, unspecified side: Secondary | ICD-10-CM | POA: Diagnosis present

## 2020-04-29 DIAGNOSIS — Z8249 Family history of ischemic heart disease and other diseases of the circulatory system: Secondary | ICD-10-CM

## 2020-04-29 LAB — BASIC METABOLIC PANEL
Anion gap: 16 — ABNORMAL HIGH (ref 5–15)
BUN: 29 mg/dL — ABNORMAL HIGH (ref 6–20)
CO2: 25 mmol/L (ref 22–32)
Calcium: 8.5 mg/dL — ABNORMAL LOW (ref 8.9–10.3)
Chloride: 91 mmol/L — ABNORMAL LOW (ref 98–111)
Creatinine, Ser: 1.66 mg/dL — ABNORMAL HIGH (ref 0.44–1.00)
GFR, Estimated: 42 mL/min — ABNORMAL LOW (ref >60)
Glucose, Bld: 135 mg/dL — ABNORMAL HIGH (ref 70–99)
Potassium: 2.6 mmol/L — CL (ref 3.5–5.1)
Sodium: 132 mmol/L — ABNORMAL LOW (ref 135–145)

## 2020-04-29 LAB — CBC WITH DIFFERENTIAL/PLATELET
Band Neutrophils: 15 %
Basophils Absolute: 0 10*3/uL (ref 0.0–0.1)
Basophils Relative: 0 %
Eosinophils Absolute: 0 10*3/uL (ref 0.0–0.5)
Eosinophils Relative: 0 %
HCT: 36.3 % (ref 36.0–46.0)
Hemoglobin: 12.9 g/dL (ref 12.0–15.0)
Lymphocytes Relative: 1 %
Lymphs Abs: 0.3 10*3/uL — ABNORMAL LOW (ref 0.7–4.0)
MCH: 31.4 pg (ref 26.0–34.0)
MCHC: 35.5 g/dL (ref 30.0–36.0)
MCV: 88.3 fL (ref 80.0–100.0)
Metamyelocytes Relative: 10 %
Monocytes Absolute: 1 10*3/uL (ref 0.1–1.0)
Monocytes Relative: 3 %
Myelocytes: 1 %
Neutro Abs: 28.6 10*3/uL — ABNORMAL HIGH (ref 1.7–7.7)
Neutrophils Relative %: 70 %
Platelets: 289 10*3/uL (ref 150–400)
RBC: 4.11 MIL/uL (ref 3.87–5.11)
RDW: 12.4 % (ref 11.5–15.5)
WBC: 33.7 10*3/uL — ABNORMAL HIGH (ref 4.0–10.5)
nRBC: 0 % (ref 0.0–0.2)

## 2020-04-29 LAB — URINALYSIS, ROUTINE W REFLEX MICROSCOPIC
Bacteria, UA: NONE SEEN
Bilirubin Urine: NEGATIVE
Glucose, UA: 50 mg/dL — AB
Ketones, ur: NEGATIVE mg/dL
Nitrite: NEGATIVE
Protein, ur: >=300 mg/dL — AB
Specific Gravity, Urine: 1.011 (ref 1.005–1.030)
WBC, UA: >50 WBC/hpf — ABNORMAL HIGH (ref 0–5)
pH: 5 (ref 5.0–8.0)

## 2020-04-29 LAB — PREGNANCY, URINE: Preg Test, Ur: NEGATIVE

## 2020-04-29 LAB — MAGNESIUM: Magnesium: 1.8 mg/dL (ref 1.7–2.4)

## 2020-04-29 LAB — RESP PANEL BY RT-PCR (FLU A&B, COVID) ARPGX2
Influenza A by PCR: NEGATIVE
Influenza B by PCR: NEGATIVE
SARS Coronavirus 2 by RT PCR: NEGATIVE

## 2020-04-29 MED ORDER — SODIUM CHLORIDE 0.9 % IV BOLUS
1000.0000 mL | Freq: Once | INTRAVENOUS | Status: AC
  Administered 2020-04-29: 1000 mL via INTRAVENOUS

## 2020-04-29 MED ORDER — FENTANYL CITRATE (PF) 100 MCG/2ML IJ SOLN
50.0000 ug | Freq: Once | INTRAMUSCULAR | Status: AC
  Administered 2020-04-29: 50 ug via INTRAVENOUS
  Filled 2020-04-29: qty 2

## 2020-04-29 MED ORDER — POLYETHYLENE GLYCOL 3350 17 G PO PACK: 17.0000 g | PACK | Freq: Every day | ORAL | Status: DC | PRN

## 2020-04-29 MED ORDER — ONDANSETRON HCL 4 MG/2ML IJ SOLN
4.0000 mg | Freq: Once | INTRAMUSCULAR | Status: AC
  Administered 2020-04-29: 4 mg via INTRAVENOUS
  Filled 2020-04-29: qty 2

## 2020-04-29 MED ORDER — SODIUM CHLORIDE 0.9 % IV SOLN
2.0000 g | Freq: Once | INTRAVENOUS | Status: AC
  Administered 2020-04-29: 2 g via INTRAVENOUS
  Filled 2020-04-29: qty 20

## 2020-04-29 MED ORDER — HYDROMORPHONE HCL 1 MG/ML IJ SOLN
1.0000 mg | INTRAMUSCULAR | Status: DC | PRN
  Administered 2020-04-29 – 2020-04-30 (×4): 1 mg via INTRAVENOUS
  Filled 2020-04-29 (×4): qty 1

## 2020-04-29 MED ORDER — OXYCODONE HCL 5 MG PO TABS
5.0000 mg | ORAL_TABLET | ORAL | Status: DC | PRN
  Administered 2020-04-30 – 2020-05-02 (×2): 5 mg via ORAL
  Filled 2020-04-29 (×3): qty 1

## 2020-04-29 MED ORDER — ENOXAPARIN SODIUM 40 MG/0.4ML ~~LOC~~ SOLN
40.0000 mg | SUBCUTANEOUS | Status: DC
  Administered 2020-04-29 – 2020-05-01 (×3): 40 mg via SUBCUTANEOUS
  Filled 2020-04-29 (×3): qty 0.4

## 2020-04-29 MED ORDER — PROMETHAZINE HCL 25 MG/ML IJ SOLN
25.0000 mg | Freq: Four times a day (QID) | INTRAMUSCULAR | Status: DC | PRN
  Administered 2020-04-29 – 2020-05-02 (×11): 25 mg via INTRAVENOUS
  Filled 2020-04-29 (×11): qty 1

## 2020-04-29 MED ORDER — CITALOPRAM HYDROBROMIDE 20 MG PO TABS
40.0000 mg | ORAL_TABLET | Freq: Every day | ORAL | Status: DC
  Filled 2020-04-29 (×2): qty 1

## 2020-04-29 MED ORDER — POTASSIUM CHLORIDE 10 MEQ/100ML IV SOLN
10.0000 meq | Freq: Once | INTRAVENOUS | Status: AC
  Administered 2020-04-29: 10 meq via INTRAVENOUS
  Filled 2020-04-29: qty 100

## 2020-04-29 MED ORDER — LACTATED RINGERS IV SOLN: INTRAVENOUS | Status: DC

## 2020-04-29 MED ORDER — KETOROLAC TROMETHAMINE 30 MG/ML IJ SOLN
30.0000 mg | Freq: Once | INTRAMUSCULAR | Status: AC
  Administered 2020-04-29: 30 mg via INTRAVENOUS
  Filled 2020-04-29: qty 1

## 2020-04-29 MED ORDER — POTASSIUM CHLORIDE 10 MEQ/100ML IV SOLN
10.0000 meq | INTRAVENOUS | Status: AC
  Administered 2020-04-29 – 2020-04-30 (×4): 10 meq via INTRAVENOUS
  Filled 2020-04-29 (×4): qty 100

## 2020-04-29 MED ORDER — ONDANSETRON HCL 4 MG PO TABS: 4.0000 mg | ORAL_TABLET | Freq: Four times a day (QID) | ORAL | Status: DC | PRN

## 2020-04-29 MED ORDER — ONDANSETRON HCL 4 MG/2ML IJ SOLN
4.0000 mg | Freq: Four times a day (QID) | INTRAMUSCULAR | Status: DC | PRN
  Administered 2020-04-30 (×2): 4 mg via INTRAVENOUS
  Filled 2020-04-29 (×2): qty 2

## 2020-04-29 MED ORDER — SODIUM CHLORIDE 0.9 % IV SOLN
2.0000 g | INTRAVENOUS | Status: DC
  Administered 2020-04-30 – 2020-05-01 (×2): 2 g via INTRAVENOUS
  Filled 2020-04-29 (×2): qty 20

## 2020-04-29 NOTE — ED Notes (Signed)
Date and time results received: 04/29/20 2021  Test: Potassium  Critical Value: 2.6  Name of Provider Notified: Graciella Freer PA  Orders Received? Or Actions Taken?: none

## 2020-04-29 NOTE — ED Provider Notes (Signed)
[facility] EMERGENCY DEPARTMENT Provider Note   CSN: [id number] Arrival date & time: 04/29/20  1458     History Chief Complaint  Patient presents with  . Flank Pain    Sitara Cashwell is a 30 y.o. female with PMH/o gestational HTN, manic depressive disorder who presents for evaluation of left-sided flank pain 5 days.  He states that it is radiated to the left lower abdomen.  She reports some increased urinary frequency but no dysuria, hematuria.  She has noted a fever at home as been as high as 102-103.  She also reports she has had nausea/vomiting.  States has not been able to keeping anything down the last couple days.  She denies any diarrhea.  She states that when she eats after she started vomiting, she started having some sore throat.  She has not been exposed to Covid.  She has no prior history of kidney stones.  The history is provided by the patient.       Past Medical History:  Diagnosis Date  . Gestational hypertension   . Hx of preeclampsia, prior pregnancy, currently pregnant   . Manic-depressive disorder [facility])     Patient Active Problem List   Diagnosis Date Noted  . Pyelonephritis 04/29/2020  . Encounter for female sterilization procedure  bilateral salpingectomy 06/27/2015  . Postpartum care following vaginal delivery 05/25/2015  . Contraception management 05/25/2015    Past Surgical History:  Procedure Laterality Date  . LAPAROSCOPIC BILATERAL SALPINGECTOMY Bilateral 06/27/2015   Procedure: LAPAROSCOPIC BILATERAL SALPINGECTOMY;  Surgeon: Tilda Burrow, MD;  Location: [facility] ORS;  Service: Gynecology;  Laterality: Bilateral;  . TONSILLECTOMY    . TUBAL LIGATION       OB History    Gravida  5   Para  5   Term  1   Preterm  4   AB  0   Living  4     SAB  0   IAB  0   Ectopic  0   Multiple  0   Live Births  5           Family History  Problem Relation Age of Onset  . Clotting disorder Mother   . Spina bifida Sister   . Other  Brother        Born at 26 weeks has a flute shunt in his brain  . Depression Brother   . Heart failure Maternal Grandmother   . Varicose Veins Maternal Grandmother   . Stroke Maternal Grandfather     Social History   Tobacco Use  . Smoking status: Current Every Day Smoker    Packs/day: 0.50    Years: 13.00    Pack years: 6.50    Types: Cigarettes  . Smokeless tobacco: Never Used  Substance Use Topics  . Alcohol use: No  . Drug use: No    Types: Marijuana    Comment: Smoked pot before she found out she was pregnant    Home Medications Prior to Admission medications   Medication Sig Start Date End Date Taking? Authorizing Provider  acetaminophen (TYLENOL) 500 MG tablet Take 1,000 mg by mouth every 6 (six) hours as needed for mild pain or headache.    [provider]  citalopram (CELEXA) 40 MG tablet Take 1 tablet (40 mg total) by mouth daily. 04/30/15   Tilda Burrow, MD  ibuprofen (ADVIL,MOTRIN) 600 MG tablet Take 1 tablet (600 mg total) by mouth every 6 (six) hours as needed. 08/19/16  Shary Decamp, PA-C  oxyCODONE-acetaminophen (PERCOCET/ROXICET) 5-325 MG tablet Take 1 tablet by mouth every 6 (six) hours as needed for severe pain. 08/19/16   Shary Decamp, PA-C  Prenatal Vit-Fe Fumarate-FA (PRENATAL MULTIVITAMIN) TABS tablet Take 1 tablet by mouth daily at 12 noon.    [provider]    Allergies    Codeine, Flexeril [cyclobenzaprine], and Hydrocodone  Review of Systems   Review of Systems  Constitutional: Positive for fever.  Respiratory: Negative for cough and shortness of breath.   Cardiovascular: Negative for chest pain.  Gastrointestinal: Positive for abdominal pain, nausea and vomiting.  Genitourinary: Positive for flank pain and frequency. Negative for dysuria and hematuria.  Neurological: Negative for headaches.  All other systems reviewed and are negative.   Physical Exam Updated Vital Signs BP 108/70 (BP Location: Left Arm)   Pulse 80    Temp 99.6 F (37.6 C) (Oral)   Resp 18   Ht 5\' 2"  (1.575 m)   Wt 50.8 kg   LMP 04/11/2020   SpO2 100%   BMI 20.49 kg/m   Physical Exam Vitals and nursing note reviewed.  Constitutional:      Appearance: Normal appearance. She is well-developed and well-nourished.     Comments: Tearful, appears uncomfortable but NAD   HENT:     Head: Normocephalic and atraumatic.     Mouth/Throat:     Mouth: Oropharynx is clear and moist and mucous membranes are normal.  Eyes:     General: Lids are normal.     Extraocular Movements: EOM normal.     Conjunctiva/sclera: Conjunctivae normal.     Pupils: Pupils are equal, round, and reactive to light.  Cardiovascular:     Rate and Rhythm: Regular rhythm. Tachycardia present.     Pulses: Normal pulses.     Heart sounds: Normal heart sounds. No murmur heard. No friction rub. No gallop.   Pulmonary:     Effort: Pulmonary effort is normal.     Breath sounds: Normal breath sounds.     Comments: Lungs clear to auscultation bilaterally.  Symmetric chest rise.  No wheezing, rales, rhonchi. Abdominal:     Palpations: Abdomen is soft. Abdomen is not rigid.     Tenderness: There is abdominal tenderness in the left lower quadrant. There is right CVA tenderness and left CVA tenderness. There is no guarding.     Comments: Abdomen is soft, nondistended.  Tenderness palpation the left lower quadrant as well as left flank.  No rigidity, guarding.  Bilateral CVA tenderness.  Musculoskeletal:        General: Normal range of motion.     Cervical back: Full passive range of motion without pain.  Skin:    General: Skin is warm and dry.     Capillary Refill: Capillary refill takes less than 2 seconds.  Neurological:     Mental Status: She is alert and oriented to person, place, and time.  Psychiatric:        Mood and Affect: Mood and affect normal.        Speech: Speech normal.     ED Results / Procedures / Treatments   Labs (all labs ordered are listed, but  only abnormal results are displayed) Labs Reviewed  URINALYSIS, ROUTINE W REFLEX MICROSCOPIC - Abnormal; Notable for the following components:      Result Value   Color, Urine AMBER (*)    APPearance TURBID (*)    Glucose, UA 50 (*)    Hgb urine dipstick  SMALL (*)    Protein, ur >=300 (*)    Leukocytes,Ua LARGE (*)    WBC, UA >50 (*)    All other components within normal limits  CBC WITH DIFFERENTIAL/PLATELET - Abnormal; Notable for the following components:   WBC 33.7 (*)    Neutro Abs 28.6 (*)    Lymphs Abs 0.3 (*)    All other components within normal limits  BASIC METABOLIC PANEL - Abnormal; Notable for the following components:   Sodium 132 (*)    Potassium 2.6 (*)    Chloride 91 (*)    Glucose, Bld 135 (*)    BUN 29 (*)    Creatinine, Ser 1.66 (*)    Calcium 8.5 (*)    GFR, Estimated 42 (*)    Anion gap 16 (*)    All other components within normal limits  RESP PANEL BY RT-PCR (FLU A&B, COVID) ARPGX2  URINE CULTURE  PREGNANCY, URINE  HIV ANTIBODY (ROUTINE TESTING W REFLEX)  CBC  BASIC METABOLIC PANEL  MAGNESIUM    EKG None  Radiology CT Renal Stone Study  Result Date: 04/29/2020 CLINICAL DATA:  Acute left flank pain. EXAM: CT ABDOMEN AND PELVIS WITHOUT CONTRAST TECHNIQUE: Multidetector CT imaging of the abdomen and pelvis was performed following the standard protocol without IV contrast. COMPARISON:  None. FINDINGS: Lower chest: No acute abnormality. Hepatobiliary: No focal liver abnormality is seen. No gallstones, gallbladder wall thickening, or biliary dilatation. Pancreas: Unremarkable. No pancreatic ductal dilatation or surrounding inflammatory changes. Spleen: Normal in size without focal abnormality. Adrenals/Urinary Tract: Adrenal glands appear normal. Nonobstructive left renal calculus is noted. No hydronephrosis or renal obstruction is noted. Urinary bladder is unremarkable. Stomach/Bowel: The stomach appears normal. There is no evidence of bowel obstruction or  inflammation. The appendix is not visualized, but no inflammation is noted in the right lower quadrant. Vascular/Lymphatic: No significant vascular findings are present. No enlarged abdominal or pelvic lymph nodes. Reproductive: Uterus is unremarkable. Probable 4 cm complex cyst seen in right adnexal region. Other: Mild fat containing periumbilical hernia is noted. No ascites is noted. Musculoskeletal: No acute or significant osseous findings. IMPRESSION: 1. Nonobstructive left renal calculus. No hydronephrosis or renal obstruction is noted. 2. Probable 4 cm complex cyst seen in right adnexal region. Pelvic ultrasound is recommended for further evaluation. 3. Mild fat containing periumbilical hernia. Electronically Signed   By: Lupita Raider M.D.   On: 04/29/2020 19:46    Procedures Procedures (including critical care time)  Medications Ordered in ED Medications  potassium chloride 10 mEq in 100 mL IVPB (10 mEq Intravenous New Bag/Given 04/29/20 2042)  citalopram (CELEXA) tablet 40 mg (has no administration in time range)  enoxaparin (LOVENOX) injection 40 mg (has no administration in time range)  lactated ringers infusion (has no administration in time range)  oxyCODONE (Oxy IR/ROXICODONE) immediate release tablet 5 mg (has no administration in time range)  ondansetron (ZOFRAN) tablet 4 mg (has no administration in time range)    Or  ondansetron (ZOFRAN) injection 4 mg (has no administration in time range)  polyethylene glycol (MIRALAX / GLYCOLAX) packet 17 g (has no administration in time range)  cefTRIAXone (ROCEPHIN) 2 g in sodium chloride 0.9 % 100 mL IVPB (has no administration in time range)  potassium chloride 10 mEq in 100 mL IVPB (has no administration in time range)  ondansetron (ZOFRAN) injection 4 mg (4 mg Intravenous Given 04/29/20 1847)  sodium chloride 0.9 % bolus 1,000 mL (0 mLs Intravenous Stopped 04/29/20 2015)  fentaNYL (SUBLIMAZE) injection 50 mcg (50 mcg Intravenous Given  04/29/20 1847)  cefTRIAXone (ROCEPHIN) 2 g in sodium chloride 0.9 % 100 mL IVPB (0 g Intravenous Stopped 04/29/20 2015)  sodium chloride 0.9 % bolus 1,000 mL (1,000 mLs Intravenous New Bag/Given 04/29/20 2042)  ketorolac (TORADOL) 30 MG/ML injection 30 mg (30 mg Intravenous Given 04/29/20 2100)    ED Course  I have reviewed the triage vital signs and the nursing notes.  Pertinent labs & imaging results that were available during my care of the patient were reviewed by me and considered in my medical decision making (see chart for details).    MDM Rules/Calculators/A&P                          30 year old female who presents for evaluation of left flank pain, nausea/vomiting, fevers that have been ongoing for last 5 days.  She states that she has not been able to tolerate any p.o. in the last day or so.  She denies any diarrhea.  On initial arrival, she is afebrile but is tachycardic.  Vitals otherwise stable.  On exam, she has left-sided CVA tenderness as well as left-sided lower abdominal tenderness.  Concern for pyelonephritis versus kidney stone versus UTI.  Labs ordered at triage.  BMP shows potassium of 2.6.  BUN 29, creatinine 1.66.  Anion gap is 16.  Urine pregnancy is negative.  CBC shows leukocytosis of 33.7.  UA shows large leukocytes, pyuria. Plan to treat. Urine culture sent. Rocephin started.   CT renal study shows nonobstructive left renal calculus.  No hydronephrosis or renal obstruction.  Probable 4 cm complex cyst seen in the right adnexal region.  UA shows small hemoglobin, large leukocytes, pyuria.  Plan for treatment. Urine culture sent.  Urine pregnancy negative.  Reevaluation.  Patient still having pain.  Most of her pain is in the left side and in the left-sided CVA.  She does not have any right-sided lower abdominal tenderness.  She still been nauseous but has had a decrease in vomiting.  We will plan to give additional analgesics.  At this time, given the hypokalemia as well  as UTI, concern for possible pyelonephritis.  We will plan for admission.  Discussed with Dr. Adrian Blackwater (hospitalist) who accepts patient for admission.  Portions of this note were generated with Scientist, clinical (histocompatibility and immunogenetics). Dictation errors may occur despite best attempts at proofreading.  Final Clinical Impression(s) / ED Diagnoses Final diagnoses:  Right ovarian cyst  Hypokalemia  Pyelonephritis    Rx / DC Orders ED Discharge Orders    None       Rosana Hoes 04/29/20 2132    Sabas Sous, MD 04/29/20 2350

## 2020-04-29 NOTE — ED Notes (Signed)
Date and time results received: 04/29/20 2010 Test: Potassium Critical Value: 2.6 Name of Provider Notified: Dr. Pilar Plate

## 2020-04-29 NOTE — H&P (Signed)
History and Physical  Cortina Vultaggio LFY:[id number] DOB: 1990/08/16 DOA: 04/29/2020  Referring physician: Gwendalyn Ege, PA-C, ED provider PCP: Patient, No Pcp Per  Outpatient Specialists:  [facility] - [facility]  Patient Coming From: home  Chief Complaint: abdominal pain, vomiting  HPI: Dereon Williamsen is a 30 y.o. female with a history of bilateral salpingectomy. Seen for 5 days of abdominal pain and vomiting. Pain located in the left lower quadrant.  Also has significant mid back pain.  Symptoms are worsening. No palliating or provoking factors. Having fevers at home, and has been unable to tolerate any food or liquid by mouth.  Her emesis is described as stomach contents.  She denies frequency, dysuria.  Emergency Department Course: UA suggestive of bladder infection.  CT negative for stone, does show an incidental right complex ovarian cyst measuring approximately 4 cm in diameter.  Review of Systems:   Pt denies any diarrhea, constipation, shortness of breath, dyspnea on exertion, orthopnea, cough, wheezing, palpitations, headache, vision changes, lightheadedness, dizziness, melena, rectal bleeding.  Review of systems are otherwise negative  Past Medical History:  Diagnosis Date  . Gestational hypertension   . Hx of preeclampsia, prior pregnancy, currently pregnant   . Manic-depressive disorder [facility])    Past Surgical History:  Procedure Laterality Date  . LAPAROSCOPIC BILATERAL SALPINGECTOMY Bilateral 06/27/2015   Procedure: LAPAROSCOPIC BILATERAL SALPINGECTOMY;  Surgeon: Tilda Burrow, MD;  Location: [facility];  Service: Gynecology;  Laterality: Bilateral;  . TONSILLECTOMY    . TUBAL LIGATION     Social History:  reports that she has been smoking cigarettes. She has a 6.50 pack-year smoking history. She has never used smokeless tobacco. She reports that she does not drink alcohol and does not use drugs. Patient lives at home  Allergies  Allergen Reactions  . Flexeril  [Cyclobenzaprine] Swelling  . Trazodone Rash  . Codeine   . Hydrocodone Itching    Family History  Problem Relation Age of Onset  . Clotting disorder Mother   . Spina bifida Sister   . Other Brother        Born at 26 weeks has a flute shunt in his brain  . Depression Brother   . Heart failure Maternal Grandmother   . Varicose Veins Maternal Grandmother   . Stroke Maternal Grandfather       Prior to Admission medications   Medication Sig Start Date End Date Taking? Authorizing Provider  acetaminophen (TYLENOL) 500 MG tablet Take 1,000 mg by mouth every 6 (six) hours as needed for mild pain or headache.    [provider]  citalopram (CELEXA) 40 MG tablet Take 1 tablet (40 mg total) by mouth daily. 04/30/15   Tilda Burrow, MD  ibuprofen (ADVIL,MOTRIN) 600 MG tablet Take 1 tablet (600 mg total) by mouth every 6 (six) hours as needed. 08/19/16   Audry Pili, PA-C  oxyCODONE-acetaminophen (PERCOCET/ROXICET) 5-325 MG tablet Take 1 tablet by mouth every 6 (six) hours as needed for severe pain. 08/19/16   Audry Pili, PA-C  Prenatal Vit-Fe Fumarate-FA (PRENATAL MULTIVITAMIN) TABS tablet Take 1 tablet by mouth daily at 12 noon.    [provider]    Physical Exam: BP 108/70 (BP Location: Left Arm)   Pulse 80   Temp 99.6 F (37.6 C) (Oral)   Resp 18   Ht 5\' 2"  (1.575 m)   Wt 50.8 kg   LMP 04/11/2020   SpO2 100%   BMI 20.49 kg/m   . General: Young female adult. Awake  and alert and oriented x3. No acute cardiopulmonary distress.  Marland Kitchen HEENT: Normocephalic atraumatic.  Right and left ears normal in appearance.  Pupils equal, round, reactive to light. Extraocular muscles are intact. Sclerae anicteric and noninjected.  Moist mucosal membranes. No mucosal lesions.  . Neck: Neck supple without lymphadenopathy. No carotid bruits. No masses palpated.  . Cardiovascular: Regular rate with normal S1-S2 sounds. No murmurs, rubs, gallops auscultated. No JVD.  Marland Kitchen Respiratory: Good  respiratory effort with no wheezes, rales, rhonchi. Lungs clear to auscultation bilaterally.  No accessory muscle use. . Abdomen: Significant tenderness on the left upper and left lower quadrant, with the left lower quadrant being more tender.  There is guarding present. Nondistended. Active bowel sounds. No masses or hepatosplenomegaly  . Skin: No rashes, lesions, or ulcerations.  Dry, warm to touch. 2+ dorsalis pedis and radial pulses. . Musculoskeletal: Significant CVA tenderness left greater than right.  No calf or leg pain. All major joints not erythematous nontender.  No upper or lower joint deformation.  Good ROM.  No contractures  . Psychiatric: Intact judgment and insight. Pleasant and cooperative. . Neurologic: No focal neurological deficits. Strength is 5/5 and symmetric in upper and lower extremities.  Cranial nerves II through XII are grossly intact.           Labs on Admission: I have personally reviewed following labs and imaging studies  CBC: Recent Labs  Lab 04/29/20 1848  WBC 33.7*  NEUTROABS 28.6*  HGB 12.9  HCT 36.3  MCV 88.3  PLT 289   Basic Metabolic Panel: Recent Labs  Lab 04/29/20 1848  NA 132*  K 2.6*  CL 91*  CO2 25  GLUCOSE 135*  BUN 29*  CREATININE 1.66*  CALCIUM 8.5*   GFR: Estimated Creatinine Clearance: 38.8 mL/min (A) (by C-G formula based on SCr of 1.66 mg/dL (H)). Liver Function Tests: No results for input(s): AST, ALT, ALKPHOS, BILITOT, PROT, ALBUMIN in the last 168 hours. No results for input(s): LIPASE, AMYLASE in the last 168 hours. No results for input(s): AMMONIA in the last 168 hours. Coagulation Profile: No results for input(s): INR, PROTIME in the last 168 hours. Cardiac Enzymes: No results for input(s): CKTOTAL, CKMB, CKMBINDEX, TROPONINI in the last 168 hours. BNP (last 3 results) No results for input(s): PROBNP in the last 8760 hours. HbA1C: No results for input(s): HGBA1C in the last 72 hours. CBG: No results for  input(s): GLUCAP in the last 168 hours. Lipid Profile: No results for input(s): CHOL, HDL, LDLCALC, TRIG, CHOLHDL, LDLDIRECT in the last 72 hours. Thyroid Function Tests: No results for input(s): TSH, T4TOTAL, FREET4, T3FREE, THYROIDAB in the last 72 hours. Anemia Panel: No results for input(s): VITAMINB12, FOLATE, FERRITIN, TIBC, IRON, RETICCTPCT in the last 72 hours. Urine analysis:    Component Value Date/Time   COLORURINE AMBER (A) 04/29/2020 1519   APPEARANCEUR TURBID (A) 04/29/2020 1519   APPEARANCEUR Clear 08/18/2014 0310   LABSPEC 1.011 04/29/2020 1519   LABSPEC 1.029 08/18/2014 0310   PHURINE 5.0 04/29/2020 1519   GLUCOSEU 50 (A) 04/29/2020 1519   GLUCOSEU Negative 08/18/2014 0310   HGBUR SMALL (A) 04/29/2020 1519   BILIRUBINUR NEGATIVE 04/29/2020 1519   BILIRUBINUR Negative 08/18/2014 0310   KETONESUR NEGATIVE 04/29/2020 1519   PROTEINUR >=300 (A) 04/29/2020 1519   UROBILINOGEN 0.2 03/10/2015 1850   NITRITE NEGATIVE 04/29/2020 1519   LEUKOCYTESUR LARGE (A) 04/29/2020 1519   LEUKOCYTESUR Negative 08/18/2014 0310   Sepsis Labs: @LABRCNTIP (procalcitonin:4,lacticidven:4) ) Recent Results (from the past  240 hour(s))  Resp Panel by RT-PCR (Flu A&B, Covid) Nasopharyngeal Swab     Status: None   Collection Time: 04/29/20  6:49 PM   Specimen: Nasopharyngeal Swab; Nasopharyngeal(NP) swabs in vial transport medium  Result Value Ref Range Status   SARS Coronavirus 2 by RT PCR NEGATIVE NEGATIVE Final    Comment: (NOTE) SARS-CoV-2 target nucleic acids are NOT DETECTED.  The SARS-CoV-2 RNA is generally detectable in upper respiratory specimens during the acute phase of infection. The lowest concentration of SARS-CoV-2 viral copies this assay can detect is 138 copies/mL. A negative result does not preclude SARS-Cov-2 infection and should not be used as the sole basis for treatment or other patient management decisions. A negative result may occur with  improper specimen  collection/handling, submission of specimen other than nasopharyngeal swab, presence of viral mutation(s) within the areas targeted by this assay, and inadequate number of viral copies(<138 copies/mL). A negative result must be combined with clinical observations, patient history, and epidemiological information. The expected result is Negative.  Fact Sheet for Patients:  BloggerCourse.com  Fact Sheet for Healthcare Providers:  SeriousBroker.it  This test is no t yet approved or cleared by the [place] FDA and  has been authorized for detection and/or diagnosis of SARS-CoV-2 by FDA under an Emergency Use Authorization (EUA). This EUA will remain  in effect (meaning this test can be used) for the duration of the COVID-19 declaration under Section 564(b)(1) of the Act, 21 U.S.C.section 360bbb-3(b)(1), unless the authorization is terminated  or revoked sooner.       Influenza A by PCR NEGATIVE NEGATIVE Final   Influenza B by PCR NEGATIVE NEGATIVE Final    Comment: (NOTE) The Xpert Xpress SARS-CoV-2/FLU/RSV plus assay is intended as an aid in the diagnosis of influenza from Nasopharyngeal swab specimens and should not be used as a sole basis for treatment. Nasal washings and aspirates are unacceptable for Xpert Xpress SARS-CoV-2/FLU/RSV testing.  Fact Sheet for Patients: BloggerCourse.com  Fact Sheet for Healthcare Providers: SeriousBroker.it  This test is not yet approved or cleared by the [place] FDA and has been authorized for detection and/or diagnosis of SARS-CoV-2 by FDA under an Emergency Use Authorization (EUA). This EUA will remain in effect (meaning this test can be used) for the duration of the COVID-19 declaration under Section 564(b)(1) of the Act, 21 U.S.C. section 360bbb-3(b)(1), unless the authorization is terminated or revoked.  Performed at [facility], [address], [place], [place] [address]      Radiological Exams on Admission: CT Renal Stone Study  Result Date: 04/29/2020 CLINICAL DATA:  Acute left flank pain. EXAM: CT ABDOMEN AND PELVIS WITHOUT CONTRAST TECHNIQUE: Multidetector CT imaging of the abdomen and pelvis was performed following the standard protocol without IV contrast. COMPARISON:  None. FINDINGS: Lower chest: No acute abnormality. Hepatobiliary: No focal liver abnormality is seen. No gallstones, gallbladder wall thickening, or biliary dilatation. Pancreas: Unremarkable. No pancreatic ductal dilatation or surrounding inflammatory changes. Spleen: Normal in size without focal abnormality. Adrenals/Urinary Tract: Adrenal glands appear normal. Nonobstructive left renal calculus is noted. No hydronephrosis or renal obstruction is noted. Urinary bladder is unremarkable. Stomach/Bowel: The stomach appears normal. There is no evidence of bowel obstruction or inflammation. The appendix is not visualized, but no inflammation is noted in the right lower quadrant. Vascular/Lymphatic: No significant vascular findings are present. No enlarged abdominal or pelvic lymph nodes. Reproductive: Uterus is unremarkable. Probable 4 cm complex cyst seen in right adnexal region. Other: Mild fat containing  periumbilical hernia is noted. No ascites is noted. Musculoskeletal: No acute or significant osseous findings. IMPRESSION: 1. Nonobstructive left renal calculus. No hydronephrosis or renal obstruction is noted. 2. Probable 4 cm complex cyst seen in right adnexal region. Pelvic ultrasound is recommended for further evaluation. 3. Mild fat containing periumbilical hernia. Electronically Signed   By: Marijo Conception M.D.   On: 04/29/2020 19:46   Assessment/Plan: Principal Problem:   Pyelonephritis Active Problems:   Ovarian cyst   Hypokalemia    This patient was discussed with the ED physician, including pertinent vitals, physical exam findings,  labs, and imaging.  We also discussed care given by the ED provider.  1. Pyelonephritis a. Urine culture pending b. Pain control c. Continue Rocephin d. IV fluids 2. Ovarian cyst a. Right-sided complex ovarian cyst b. With the degree of the patient's left-sided pelvic pain, I am concerned about possible ovarian torsion.  Left ovary not readily identified on CT. c. Will get emergent pelvic ultrasound tonight to rule this out. d. Obviously if she has an ovarian torsion, she will need surgery if she were to salvage the ovary. e. Regardless, she will need follow-up outpatient with GYN in order to follow the complex cyst to ensure resolution.  I have [facility] a message to [facility] for follow-up. 3. Hypokalemia a. Replace potassium and recheck in the morning b. Check magnesium  DVT prophylaxis: lovenox Consultants: none Code Status: Full Family Communication:   Disposition Plan: return home following improvement.   Truett Mainland, DO

## 2020-04-29 NOTE — ED Triage Notes (Signed)
Pt with left flank pain for past 3 days with N/V.  Denies hx of kidney stones.  Denies burning with urination.

## 2020-04-30 DIAGNOSIS — N390 Urinary tract infection, site not specified: Secondary | ICD-10-CM

## 2020-04-30 DIAGNOSIS — A419 Sepsis, unspecified organism: Secondary | ICD-10-CM

## 2020-04-30 LAB — BASIC METABOLIC PANEL
Anion gap: 8 (ref 5–15)
BUN: 19 mg/dL (ref 6–20)
CO2: 22 mmol/L (ref 22–32)
Calcium: 7.6 mg/dL — ABNORMAL LOW (ref 8.9–10.3)
Chloride: 100 mmol/L (ref 98–111)
Creatinine, Ser: 1.04 mg/dL — ABNORMAL HIGH (ref 0.44–1.00)
GFR, Estimated: >60 mL/min (ref >60)
Glucose, Bld: 102 mg/dL — ABNORMAL HIGH (ref 70–99)
Potassium: 2.8 mmol/L — ABNORMAL LOW (ref 3.5–5.1)
Sodium: 130 mmol/L — ABNORMAL LOW (ref 135–145)

## 2020-04-30 LAB — CBC
HCT: 30.5 % — ABNORMAL LOW (ref 36.0–46.0)
Hemoglobin: 10.5 g/dL — ABNORMAL LOW (ref 12.0–15.0)
MCH: 31.2 pg (ref 26.0–34.0)
MCHC: 34.4 g/dL (ref 30.0–36.0)
MCV: 90.5 fL (ref 80.0–100.0)
Platelets: 242 10*3/uL (ref 150–400)
RBC: 3.37 MIL/uL — ABNORMAL LOW (ref 3.87–5.11)
RDW: 12.7 % (ref 11.5–15.5)
WBC: 19.8 10*3/uL — ABNORMAL HIGH (ref 4.0–10.5)
nRBC: 0 % (ref 0.0–0.2)

## 2020-04-30 LAB — HIV ANTIBODY (ROUTINE TESTING W REFLEX): HIV Screen 4th Generation wRfx: NONREACTIVE

## 2020-04-30 MED ORDER — ONDANSETRON HCL 4 MG PO TABS: 4.0000 mg | ORAL_TABLET | ORAL | Status: DC | PRN

## 2020-04-30 MED ORDER — POLYETHYLENE GLYCOL 3350 17 G PO PACK
17.0000 g | PACK | Freq: Two times a day (BID) | ORAL | Status: DC
  Administered 2020-04-30 – 2020-05-01 (×3): 17 g via ORAL
  Filled 2020-04-30 (×5): qty 1

## 2020-04-30 MED ORDER — POTASSIUM CHLORIDE 10 MEQ/100ML IV SOLN
INTRAVENOUS | Status: AC
  Administered 2020-04-30: 10 meq
  Filled 2020-04-30: qty 100

## 2020-04-30 MED ORDER — POTASSIUM CHLORIDE CRYS ER 20 MEQ PO TBCR
40.0000 meq | EXTENDED_RELEASE_TABLET | ORAL | Status: AC
  Administered 2020-04-30 (×2): 40 meq via ORAL
  Filled 2020-04-30 (×2): qty 2

## 2020-04-30 MED ORDER — ACETAMINOPHEN 325 MG PO TABS
650.0000 mg | ORAL_TABLET | Freq: Four times a day (QID) | ORAL | Status: DC | PRN
  Administered 2020-04-30 – 2020-05-01 (×4): 650 mg via ORAL
  Filled 2020-04-30 (×4): qty 2

## 2020-04-30 MED ORDER — POTASSIUM CHLORIDE 10 MEQ/100ML IV SOLN
10.0000 meq | INTRAVENOUS | Status: AC
  Administered 2020-04-30 (×4): 10 meq via INTRAVENOUS
  Filled 2020-04-30 (×3): qty 100

## 2020-04-30 MED ORDER — ONDANSETRON HCL 4 MG/2ML IJ SOLN
INTRAMUSCULAR | Status: AC
  Administered 2020-04-30: 4 mg
  Filled 2020-04-30: qty 2

## 2020-04-30 MED ORDER — HYDROMORPHONE HCL 1 MG/ML IJ SOLN
0.5000 mg | INTRAMUSCULAR | Status: DC | PRN
  Administered 2020-04-30 – 2020-05-02 (×15): 0.5 mg via INTRAVENOUS
  Filled 2020-04-30 (×16): qty 0.5

## 2020-04-30 MED ORDER — ONDANSETRON HCL 4 MG/2ML IJ SOLN
4.0000 mg | INTRAMUSCULAR | Status: DC | PRN
  Administered 2020-04-30 – 2020-05-02 (×7): 4 mg via INTRAVENOUS
  Filled 2020-04-30 (×7): qty 2

## 2020-04-30 MED ORDER — SENNOSIDES-DOCUSATE SODIUM 8.6-50 MG PO TABS
2.0000 | ORAL_TABLET | Freq: Every day | ORAL | Status: DC
  Administered 2020-04-30: 2 via ORAL
  Filled 2020-04-30 (×2): qty 2

## 2020-04-30 NOTE — Progress Notes (Signed)
   04/30/20 0734  Assess: MEWS Score  Temp (!) 102 F (38.9 C)  BP 108/73  Pulse Rate (!) 102  Resp 18  Level of Consciousness Alert  SpO2 97 %  O2 Device Room Air  Assess: MEWS Score  MEWS Temp 2  MEWS Systolic 0  MEWS Pulse 1  MEWS RR 0  MEWS LOC 0  MEWS Score 3  MEWS Score Color Yellow  Assess: if the MEWS score is Yellow or Red  Were vital signs taken at a resting state? Yes  Focused Assessment No change from prior assessment  Early Detection of Sepsis Score *See Row Information* Low  MEWS guidelines implemented *See Row Information* Yes  Treat  MEWS Interventions Administered prn meds/treatments  Pain Scale 0-10  Pain Score 9  Pain Type Acute pain  Pain Location Flank  Pain Orientation Left  Pain Descriptors / Indicators Constant  Pain Frequency Constant  Pain Onset On-going  Patients Stated Pain Goal 1  Pain Intervention(s) Medication (See eMAR)  Take Vital Signs  Increase Vital Sign Frequency  Yellow: Q 2hr X 2 then Q 4hr X 2, if remains yellow, continue Q 4hrs  Escalate  MEWS: Escalate Yellow: discuss with charge nurse/RN and consider discussing with provider and RRT  Notify: Charge Nurse/RN  Name of Charge Nurse/RN Notified Johnsie Cancel RN  Date Charge Nurse/RN Notified 04/30/20  Time Charge Nurse/RN Notified 0745

## 2020-05-01 DIAGNOSIS — F3162 Bipolar disorder, current episode mixed, moderate: Secondary | ICD-10-CM | POA: Diagnosis present

## 2020-05-01 DIAGNOSIS — A419 Sepsis, unspecified organism: Secondary | ICD-10-CM | POA: Diagnosis present

## 2020-05-01 LAB — CBC
HCT: 29.5 % — ABNORMAL LOW (ref 36.0–46.0)
Hemoglobin: 10.1 g/dL — ABNORMAL LOW (ref 12.0–15.0)
MCH: 31 pg (ref 26.0–34.0)
MCHC: 34.2 g/dL (ref 30.0–36.0)
MCV: 90.5 fL (ref 80.0–100.0)
Platelets: 261 10*3/uL (ref 150–400)
RBC: 3.26 MIL/uL — ABNORMAL LOW (ref 3.87–5.11)
RDW: 13 % (ref 11.5–15.5)
WBC: 12.8 10*3/uL — ABNORMAL HIGH (ref 4.0–10.5)
nRBC: 0 % (ref 0.0–0.2)

## 2020-05-01 LAB — BASIC METABOLIC PANEL
Anion gap: 9 (ref 5–15)
BUN: 7 mg/dL (ref 6–20)
CO2: 25 mmol/L (ref 22–32)
Calcium: 7.8 mg/dL — ABNORMAL LOW (ref 8.9–10.3)
Chloride: 100 mmol/L (ref 98–111)
Creatinine, Ser: 0.65 mg/dL (ref 0.44–1.00)
GFR, Estimated: >60 mL/min (ref >60)
Glucose, Bld: 113 mg/dL — ABNORMAL HIGH (ref 70–99)
Potassium: 2.7 mmol/L — CL (ref 3.5–5.1)
Sodium: 134 mmol/L — ABNORMAL LOW (ref 135–145)

## 2020-05-01 MED ORDER — MAGNESIUM SULFATE 2 GM/50ML IV SOLN
2.0000 g | Freq: Once | INTRAVENOUS | Status: AC
  Administered 2020-05-01: 2 g via INTRAVENOUS
  Filled 2020-05-01: qty 50

## 2020-05-01 MED ORDER — MELATONIN 3 MG PO TABS
6.0000 mg | ORAL_TABLET | Freq: Once | ORAL | Status: DC
  Filled 2020-05-01: qty 2

## 2020-05-01 MED ORDER — TAMSULOSIN HCL 0.4 MG PO CAPS
0.4000 mg | ORAL_CAPSULE | Freq: Every day | ORAL | Status: DC
  Administered 2020-05-01 – 2020-05-02 (×2): 0.4 mg via ORAL
  Filled 2020-05-01 (×2): qty 1

## 2020-05-01 MED ORDER — POTASSIUM CHLORIDE CRYS ER 20 MEQ PO TBCR
40.0000 meq | EXTENDED_RELEASE_TABLET | ORAL | Status: AC
  Administered 2020-05-01 (×2): 40 meq via ORAL
  Filled 2020-05-01 (×2): qty 2

## 2020-05-01 MED ORDER — POTASSIUM CHLORIDE 10 MEQ/100ML IV SOLN
10.0000 meq | INTRAVENOUS | Status: AC
  Administered 2020-05-01 (×4): 10 meq via INTRAVENOUS
  Filled 2020-05-01 (×3): qty 100
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
## 2020-05-01 NOTE — Progress Notes (Signed)
CRITICAL VALUE ALERT  Critical Value:  Potassium 2.7  Date & Time Notied:  05/01/2020  1025  Provider Notified: Dr. Marisa Severin  Orders Received/Actions taken: yes
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
## 2020-05-02 LAB — CBC
HCT: 31.2 % — ABNORMAL LOW (ref 36.0–46.0)
Hemoglobin: 10.7 g/dL — ABNORMAL LOW (ref 12.0–15.0)
MCH: 31.4 pg (ref 26.0–34.0)
MCHC: 34.3 g/dL (ref 30.0–36.0)
MCV: 91.5 fL (ref 80.0–100.0)
Platelets: 321 10*3/uL (ref 150–400)
RBC: 3.41 MIL/uL — ABNORMAL LOW (ref 3.87–5.11)
RDW: 13.2 % (ref 11.5–15.5)
WBC: 11.9 10*3/uL — ABNORMAL HIGH (ref 4.0–10.5)
nRBC: 0 % (ref 0.0–0.2)

## 2020-05-02 LAB — URINE CULTURE: Culture: >=100000 — AB

## 2020-05-02 LAB — BASIC METABOLIC PANEL
Anion gap: 10 (ref 5–15)
BUN: 6 mg/dL (ref 6–20)
CO2: 26 mmol/L (ref 22–32)
Calcium: 8 mg/dL — ABNORMAL LOW (ref 8.9–10.3)
Chloride: 98 mmol/L (ref 98–111)
Creatinine, Ser: 0.64 mg/dL (ref 0.44–1.00)
GFR, Estimated: >60 mL/min (ref >60)
Glucose, Bld: 133 mg/dL — ABNORMAL HIGH (ref 70–99)
Potassium: 3.2 mmol/L — ABNORMAL LOW (ref 3.5–5.1)
Sodium: 134 mmol/L — ABNORMAL LOW (ref 135–145)

## 2020-05-02 LAB — MAGNESIUM: Magnesium: 1.6 mg/dL — ABNORMAL LOW (ref 1.7–2.4)

## 2020-05-02 MED ORDER — POTASSIUM CHLORIDE 10 MEQ/100ML IV SOLN
10.0000 meq | INTRAVENOUS | Status: AC
  Administered 2020-05-02 (×4): 10 meq via INTRAVENOUS
  Filled 2020-05-02: qty 100

## 2020-05-02 MED ORDER — SODIUM CHLORIDE 0.9 % IV SOLN
1.0000 g | INTRAVENOUS | Status: DC
  Administered 2020-05-02: 1 g via INTRAVENOUS
  Filled 2020-05-02: qty 10

## 2020-05-02 MED ORDER — POTASSIUM CHLORIDE CRYS ER 20 MEQ PO TBCR
40.0000 meq | EXTENDED_RELEASE_TABLET | ORAL | Status: AC
  Administered 2020-05-02 (×2): 40 meq via ORAL
  Filled 2020-05-02 (×2): qty 2

## 2020-05-02 MED ORDER — MAGNESIUM SULFATE 4 GM/100ML IV SOLN
4.0000 g | Freq: Once | INTRAVENOUS | Status: AC
  Administered 2020-05-02: 4 g via INTRAVENOUS
  Filled 2020-05-02: qty 100

## 2020-05-02 MED ORDER — ONDANSETRON 4 MG PO TBDP: 4.0000 mg | ORAL_TABLET | Freq: Three times a day (TID) | ORAL | 0 refills | Status: AC | PRN

## 2020-05-02 MED ORDER — ACETAMINOPHEN 325 MG PO TABS: 650.0000 mg | ORAL_TABLET | Freq: Four times a day (QID) | ORAL | Status: AC | PRN

## 2020-05-02 MED ORDER — CEPHALEXIN 500 MG PO CAPS: 500.0000 mg | ORAL_CAPSULE | Freq: Three times a day (TID) | ORAL | 0 refills | Status: AC

## 2020-05-02 NOTE — Discharge Instructions (Signed)
1)Please drink plenty of fluids 2) please take your medications including Keflex antibiotic as prescribed 3) please follow-up with your primary care physician in 5 to 7 days for recheck and reevaluation-

## 2020-05-02 NOTE — Discharge Summary (Signed)
Brandy Hickman, is a 30 y.o. female  DOB 02/21/1991  MRN [id number].  Admission date:  04/29/2020  Admitting Physician  Levie Heritage, DO  Discharge Date:  05/02/2020   Primary MD  Patient, No Pcp Per  Recommendations for primary care physician for things to follow:   1)Please drink plenty of fluids 2) please take your medications including Keflex antibiotic as prescribed 3) please follow-up with your primary care physician in 5 to 7 days for recheck and reevaluation-  Admission Diagnosis  Hypokalemia [E87.6] Pyelonephritis [N12] Right ovarian cyst [N83.201]  Discharge Diagnosis  Hypokalemia [E87.6] Pyelonephritis [N12] Right ovarian cyst [N83.201]    Principal Problem:   Sepsis secondary to UTI [facility]) Active Problems:   Pyelonephritis   Bipolar 1 disorder with manic depressive episodes   Ovarian cyst   Hypokalemia      Past Medical History:  Diagnosis Date  . Gestational hypertension   . Hx of preeclampsia, prior pregnancy, currently pregnant   . Manic-depressive disorder [facility])     Past Surgical History:  Procedure Laterality Date  . LAPAROSCOPIC BILATERAL SALPINGECTOMY Bilateral 06/27/2015   Procedure: LAPAROSCOPIC BILATERAL SALPINGECTOMY;  Surgeon: Tilda Burrow, MD;  Location: [facility];  Service: Gynecology;  Laterality: Bilateral;  . TONSILLECTOMY    . TUBAL LIGATION       HPI  from the history and physical done on the day of admission:    Patient Coming From: home  Chief Complaint: abdominal pain, vomiting  HPI: Brandy Hickman is a 30 y.o. female with a history of bilateral salpingectomy. Seen for 5 days of abdominal pain and vomiting. Pain located in the left lower quadrant.  Also has significant mid back pain.  Symptoms are worsening. No palliating or provoking factors. Having fevers at home, and has been unable to tolerate any food or liquid by mouth.  Her emesis is  described as stomach contents.  She denies frequency, dysuria.  Emergency Department Course: UA suggestive of bladder infection.  CT negative for stone, does show an incidental right complex ovarian cyst measuring approximately 4 cm in diameter.    Hospital Course:    Brief Summary:- 30 y.o.femalewith a history of bilateral salpingectomy, history of bipolar disorder abuse admitted on 04/29/2020 with concerns for left-sided pyelonephritis -Continues to have fevers and chills and urinary symptoms, on antibiotics awaiting final urine culture data  A/p 1)Sepsis secondary to  E. coli UTI/Pyelonephritis---POA-- patient admitted with fevers, leukocytosis and  tachycardia -Treated with IV fluids,   IV Rocephin  -Nonobstructive left renal calculus noted--- c/n Flomax -Sepsis pathophysiology resolved, discharged home on p.o. Keflex per sensitivity report  2)Left-sided Pyelonephritis--- urine cx with pansensitive E. Coli,  treated as above in #1   3)Bipolar Disorder----overall appears stable, up with PCP as outpatient  4) hypokalemia/Hyponatremia----sodium is up to 134 from 130,  -Replaced potassium  5)Right-sided Complex Ovarian Cyst--- CT stone protocol findings noted, pelvic ultrasound consistent with 2.1 cm small ovarian cyst versus hemorrhagic follicle without torsion  6)Constipation--- laxatives as ordered  7)AKI--- due  to dehydration and UTI with pyelonephritis, creatinine is down to 0.6 from 1.66 -After aggressive hydration - renally adjust medications, avoid nephrotoxic agents / dehydration  / hypotension   Disposition--- discharge home on p.o. Keflex in stable condition   Code Status :  -  Code Status: Full Code   Family Communication:    NA (patient is alert, awake and coherent)   Consults  :  na  Discharge Condition: Stable  Follow UP--PCP  Diet and Activity recommendation:  As advised  Discharge Instructions  * Discharge Instructions    Call MD  for:  difficulty breathing, headache or visual disturbances   Complete by: As directed    Call MD for:  persistant dizziness or light-headedness   Complete by: As directed    Call MD for:  persistant nausea and vomiting   Complete by: As directed    Call MD for:  temperature >100.4   Complete by: As directed    Diet general   Complete by: As directed    Discharge instructions   Complete by: As directed    1)Please drink plenty of fluids 2) please take your medications including Keflex antibiotic as prescribed 3) please follow-up with your primary care physician in 5 to 7 days for recheck and reevaluation-   Increase activity slowly   Complete by: As directed         Discharge Medications     Allergies as of 05/02/2020      Reactions   Flexeril [cyclobenzaprine] Swelling   Trazodone Rash   Codeine    Hydrocodone Itching      Medication List    STOP taking these medications   ibuprofen 600 MG tablet Commonly known as: ADVIL   oxyCODONE-acetaminophen 5-325 MG tablet Commonly known as: PERCOCET/ROXICET     TAKE these medications   acetaminophen 325 MG tablet Commonly known as: TYLENOL Take 2 tablets (650 mg total) by mouth every 6 (six) hours as needed for mild pain, fever or headache. What changed:   medication strength  how much to take  reasons to take this   cephALEXin 500 MG capsule Commonly known as: Keflex Take 1 capsule (500 mg total) by mouth 3 (three) times daily for 5 days. Start on Wednesday 05/03/20 Start taking on: May 03, 2020   citalopram 40 MG tablet Commonly known as: CELEXA Take 1 tablet (40 mg total) by mouth daily.   ondansetron 4 MG disintegrating tablet Commonly known as: Zofran ODT Take 1 tablet (4 mg total) by mouth every 8 (eight) hours as needed for nausea or vomiting.       Major procedures and Radiology Reports - PLEASE review detailed and final reports for all details, in brief -    CT Renal Stone Study  Result Date:  04/29/2020 CLINICAL DATA:  Acute left flank pain. EXAM: CT ABDOMEN AND PELVIS WITHOUT CONTRAST TECHNIQUE: Multidetector CT imaging of the abdomen and pelvis was performed following the standard protocol without IV contrast. COMPARISON:  None. FINDINGS: Lower chest: No acute abnormality. Hepatobiliary: No focal liver abnormality is seen. No gallstones, gallbladder wall thickening, or biliary dilatation. Pancreas: Unremarkable. No pancreatic ductal dilatation or surrounding inflammatory changes. Spleen: Normal in size without focal abnormality. Adrenals/Urinary Tract: Adrenal glands appear normal. Nonobstructive left renal calculus is noted. No hydronephrosis or renal obstruction is noted. Urinary bladder is unremarkable. Stomach/Bowel: The stomach appears normal. There is no evidence of bowel obstruction or inflammation. The appendix is not visualized, but no inflammation is noted in  the right lower quadrant. Vascular/Lymphatic: No significant vascular findings are present. No enlarged abdominal or pelvic lymph nodes. Reproductive: Uterus is unremarkable. Probable 4 cm complex cyst seen in right adnexal region. Other: Mild fat containing periumbilical hernia is noted. No ascites is noted. Musculoskeletal: No acute or significant osseous findings. IMPRESSION: 1. Nonobstructive left renal calculus. No hydronephrosis or renal obstruction is noted. 2. Probable 4 cm complex cyst seen in right adnexal region. Pelvic ultrasound is recommended for further evaluation. 3. Mild fat containing periumbilical hernia. Electronically Signed   By: Lupita Raider M.D.   On: 04/29/2020 19:46   US PELVIC COMPLETE W TRANSVAGINAL AND TORSION R/O  Result Date: 04/29/2020 CLINICAL DATA:  Left flank pain EXAM: TRANSABDOMINAL AND TRANSVAGINAL ULTRASOUND OF PELVIS DOPPLER ULTRASOUND OF OVARIES TECHNIQUE: Both transabdominal and transvaginal ultrasound examinations of the pelvis were performed. Transabdominal technique was performed for  global imaging of the pelvis including uterus, ovaries, adnexal regions, and pelvic cul-de-sac. It was necessary to proceed with endovaginal exam following the transabdominal exam to visualize the uterus endometrium ovaries. Color and duplex Doppler ultrasound was utilized to evaluate blood flow to the ovaries. COMPARISON:  CT 04/29/2020 FINDINGS: Uterus Measurements: 7.3 x 4.1 x 5.2 cm = volume: 81.2 mL. No fibroids or other mass visualized. Endometrium Thickness: 7 mm.  No focal abnormality visualized. Right ovary Measurements: 3.6 x 2.7 by 3.3 cm = volume: 16.5 mL. Multiple follicles. Probable hemorrhagic follicle or small cysts measuring up to 2.1 cm. Left ovary Measurements: 3.4 x 2 x 1.8 cm = volume: 6.1 mL. Normal appearance/no adnexal mass. Pulsed Doppler evaluation of both ovaries demonstrates normal low-resistance arterial and venous waveforms. Other findings No abnormal free fluid. IMPRESSION: 1. Negative for ovarian torsion. 2. 2.1 cm probable hemorrhagic follicle or small cyst in the right ovary. No further imaging follow-up is recommended. Electronically Signed   By: Jasmine Pang M.D.   On: 04/29/2020 23:06    Micro Results    Recent Results (from the past 240 hour(s))  Urine culture     Status: Abnormal   Collection Time: 04/29/20  3:49 PM   Specimen: Urine, Clean Catch  Result Value Ref Range Status   Specimen Description   Final    URINE, CLEAN CATCH Performed at [facility], [address], [place], [place] [address]    Special Requests   Final    NONE Performed at [facility], [address], [place], [place] [address]    Culture >=100,000 COLONIES/mL ESCHERICHIA COLI (A)  Final   Report Status 05/02/2020 FINAL  Final   Organism ID, Bacteria ESCHERICHIA COLI (A)  Final      Susceptibility   Escherichia coli - MIC*    AMPICILLIN <=2 SENSITIVE Sensitive     CEFAZOLIN <=4 SENSITIVE Sensitive     CEFEPIME <=0.12 SENSITIVE Sensitive     CEFTRIAXONE <=0.25 SENSITIVE Sensitive      CIPROFLOXACIN <=0.25 SENSITIVE Sensitive     GENTAMICIN <=1 SENSITIVE Sensitive     IMIPENEM <=0.25 SENSITIVE Sensitive     NITROFURANTOIN <=16 SENSITIVE Sensitive     TRIMETH/SULFA <=20 SENSITIVE Sensitive     AMPICILLIN/SULBACTAM <=2 SENSITIVE Sensitive     PIP/TAZO <=4 SENSITIVE Sensitive     * >=100,000 COLONIES/mL ESCHERICHIA COLI  Resp Panel by RT-PCR (Flu A&B, Covid) Nasopharyngeal Swab     Status: None   Collection Time: 04/29/20  6:49 PM   Specimen: Nasopharyngeal Swab; Nasopharyngeal(NP) swabs in vial transport medium  Result Value Ref Range Status  SARS Coronavirus 2 by RT PCR NEGATIVE NEGATIVE Final    Comment: (NOTE) SARS-CoV-2 target nucleic acids are NOT DETECTED.  The SARS-CoV-2 RNA is generally detectable in upper respiratory specimens during the acute phase of infection. The lowest concentration of SARS-CoV-2 viral copies this assay can detect is 138 copies/mL. A negative result does not preclude SARS-Cov-2 infection and should not be used as the sole basis for treatment or other patient management decisions. A negative result may occur with  improper specimen collection/handling, submission of specimen other than nasopharyngeal swab, presence of viral mutation(s) within the areas targeted by this assay, and inadequate number of viral copies(<138 copies/mL). A negative result must be combined with clinical observations, patient history, and epidemiological information. The expected result is Negative.  Fact Sheet for Patients:  EntrepreneurPulse.com.au  Fact Sheet for Healthcare Providers:  IncredibleEmployment.be  This test is no t yet approved or cleared by the [place] FDA and  has been authorized for detection and/or diagnosis of SARS-CoV-2 by FDA under an Emergency Use Authorization (EUA). This EUA will remain  in effect (meaning this test can be used) for the duration of the COVID-19 declaration under Section  564(b)(1) of the Act, 21 U.S.C.section 360bbb-3(b)(1), unless the authorization is terminated  or revoked sooner.       Influenza A by PCR NEGATIVE NEGATIVE Final   Influenza B by PCR NEGATIVE NEGATIVE Final    Comment: (NOTE) The Xpert Xpress SARS-CoV-2/FLU/RSV plus assay is intended as an aid in the diagnosis of influenza from Nasopharyngeal swab specimens and should not be used as a sole basis for treatment. Nasal washings and aspirates are unacceptable for Xpert Xpress SARS-CoV-2/FLU/RSV testing.  Fact Sheet for Patients: EntrepreneurPulse.com.au  Fact Sheet for Healthcare Providers: IncredibleEmployment.be  This test is not yet approved or cleared by the [place] FDA and has been authorized for detection and/or diagnosis of SARS-CoV-2 by FDA under an Emergency Use Authorization (EUA). This EUA will remain in effect (meaning this test can be used) for the duration of the COVID-19 declaration under Section 564(b)(1) of the Act, 21 U.S.C. section 360bbb-3(b)(1), unless the authorization is terminated or revoked.  Performed at [facility], [address], [place], [place] [address]        Today   Subjective    Brandy Hickman today has no new complaints No fever  Or chills   No Nausea, Vomiting or Diarrhea        Patient has been seen and examined prior to discharge   Objective   Blood pressure 132/83, pulse 80, temperature 98.3 F (36.8 C), resp. rate 18, height 5\' 2"  (1.575 m), weight 51 kg, last menstrual period 04/11/2020, SpO2 98 %, currently breastfeeding.   Intake/Output Summary (Last 24 hours) at 05/02/2020 1055 Last data filed at 05/01/2020 1700 Gross per 24 hour  Intake 3962.05 ml  Output --  Net 3962.05 ml    Exam Gen:- Awake Alert, no acute distress  HEENT:- .AT, No sclera icterus Neck-Supple Neck,No JVD,.  Lungs-  CTAB , good air movement bilaterally  CV- S1, S2 normal, regular Abd-  +ve  B.Sounds, Abd Soft, No tenderness, no CVA area tenderness Extremity/Skin:- No  edema,   good pulses Psych-affect is appropriate, oriented x3 Neuro-no new focal deficits, no tremors    Data Review   CBC w Diff:  Lab Results  Component Value Date   WBC 11.9 (H) 05/02/2020   HGB 10.7 (L) 05/02/2020   HGB 12.9 08/18/2014   HCT 31.2 (L)  05/02/2020   HCT 38.5 08/18/2014   PLT 321 05/02/2020   PLT 296 08/18/2014   LYMPHOPCT 1 04/29/2020   BANDSPCT 15 04/29/2020   MONOPCT 3 04/29/2020   EOSPCT 0 04/29/2020   BASOPCT 0 04/29/2020    CMP:  Lab Results  Component Value Date   NA 134 (L) 05/02/2020   NA 137 08/18/2014   K 3.2 (L) 05/02/2020   K 3.8 08/18/2014   CL 98 05/02/2020   CL 104 08/18/2014   CO2 26 05/02/2020   CO2 27 08/18/2014   BUN 6 05/02/2020   BUN 12 08/18/2014   CREATININE 0.64 05/02/2020   CREATININE 0.43 (L) 08/18/2014   PROT 6.9 06/22/2015   PROT 7.3 08/18/2014   ALBUMIN 3.9 06/22/2015   ALBUMIN 4.5 08/18/2014   BILITOT 0.4 06/22/2015   BILITOT 0.6 08/18/2014   ALKPHOS 47 06/22/2015   ALKPHOS 40 08/18/2014   AST 17 06/22/2015   AST 16 08/18/2014   ALT 14 06/22/2015   ALT 12 (L) 08/18/2014  .   Total Discharge time is about 33 minutes  Shon Hale M.D on 05/02/2020 at 10:55 AM  Go to [email or web address] -  for contact info  [facility] - Office  [phone]

## 2020-05-16 ENCOUNTER — Telehealth: Payer: Self-pay | Admitting: *Deleted

## 2020-05-16 NOTE — Telephone Encounter (Signed)
Called to get patient set up for u/s and visit with Dr Despina Hidden but voicemail is not set up.

## 2020-05-25 ENCOUNTER — Telehealth: Payer: Self-pay | Admitting: *Deleted

## 2020-05-25 NOTE — Telephone Encounter (Signed)
Multiple attempts made to reach patient.

## 2022-09-09 IMAGING — CT CT RENAL STONE PROTOCOL
2 of 4 series · 16 of 46 positions shown, 18 images · non-contrast
Comparison: None.

CLINICAL DATA: Acute left flank pain.

EXAM:
CT ABDOMEN AND PELVIS WITHOUT CONTRAST
TECHNIQUE: Multidetector CT imaging of the abdomen and pelvis was performed
following the standard protocol without IV contrast.

[Series 2: axial st · axial · 0.66mm/px · z∈[-518,-158]mm · 13 of 82 slices shown, 15 images]
[im 5/82  soft-tissue]
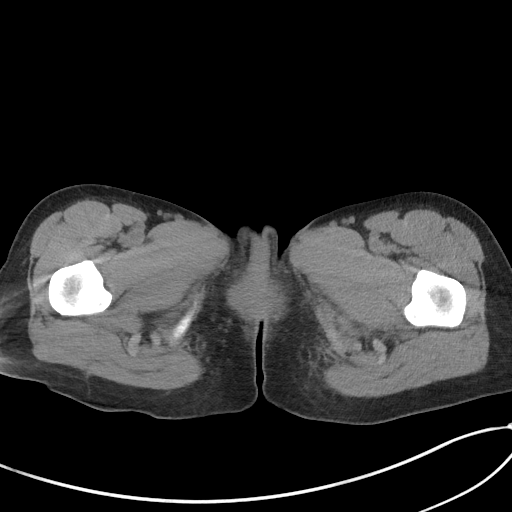
[im 5/82  bone]
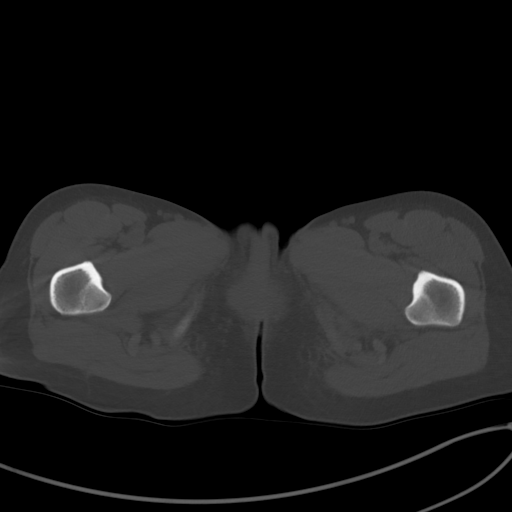
[im 13/82  soft-tissue]
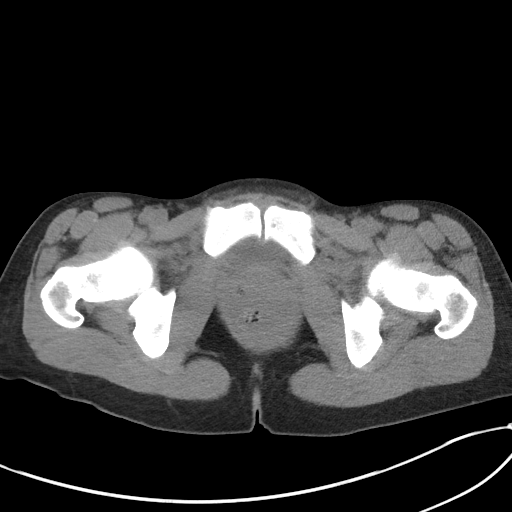
[im 18/82  soft-tissue]
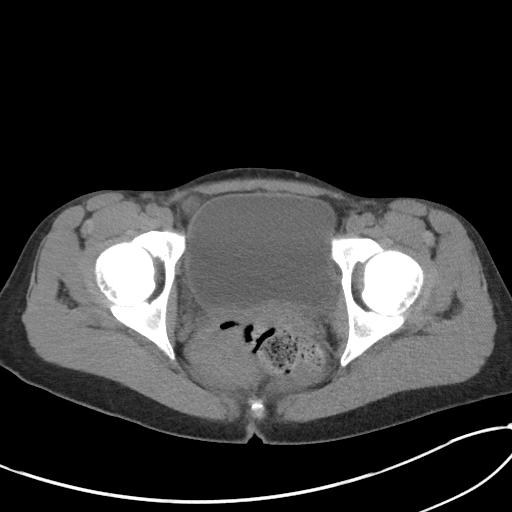
[im 22/82  soft-tissue]
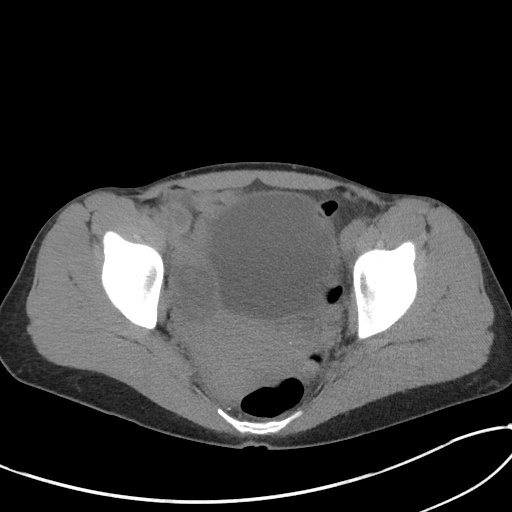
[im 30/82  soft-tissue]
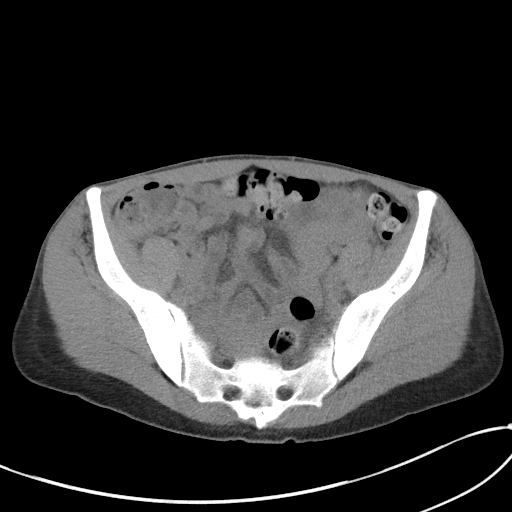
[im 35/82  soft-tissue]
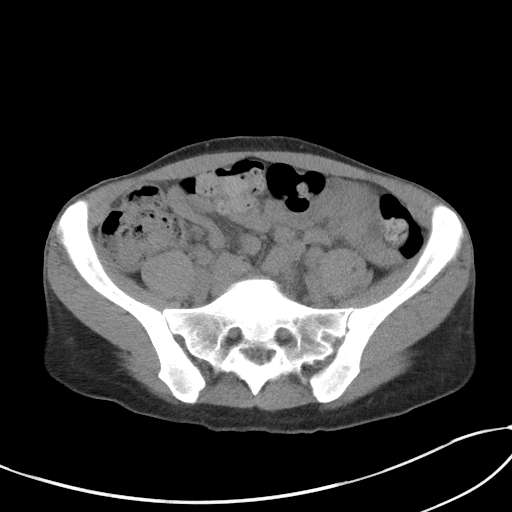
[im 43/82  soft-tissue]
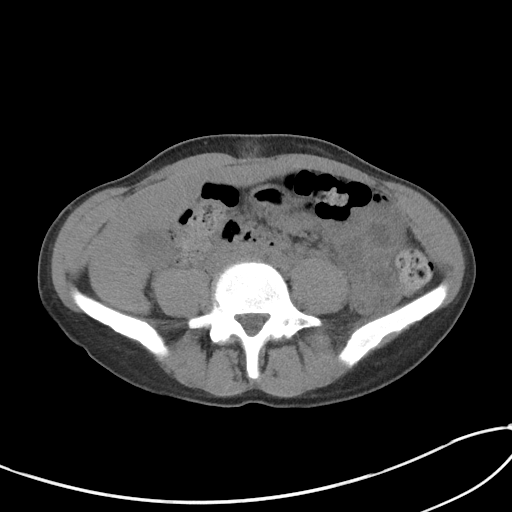
[im 47/82  soft-tissue]
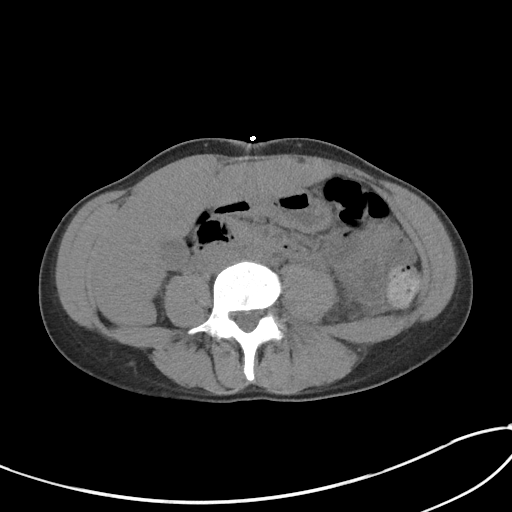
[im 52/82  soft-tissue]
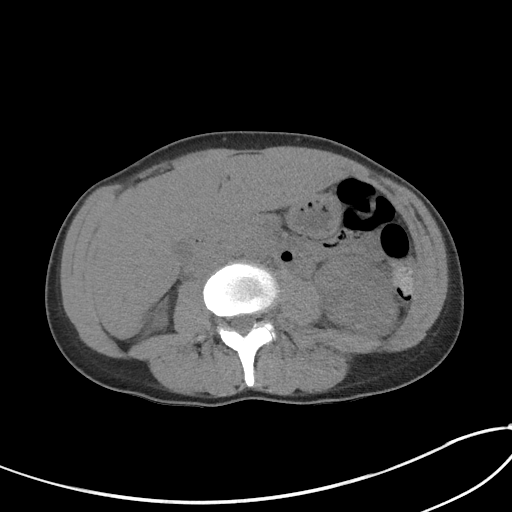
[im 52/82  bone]
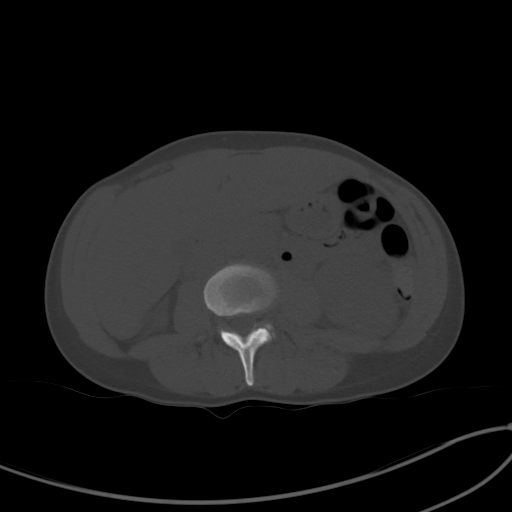
[im 60/82  soft-tissue]
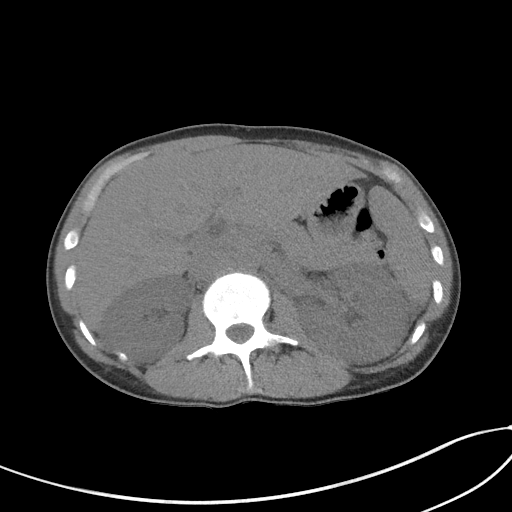
[im 64/82  soft-tissue]
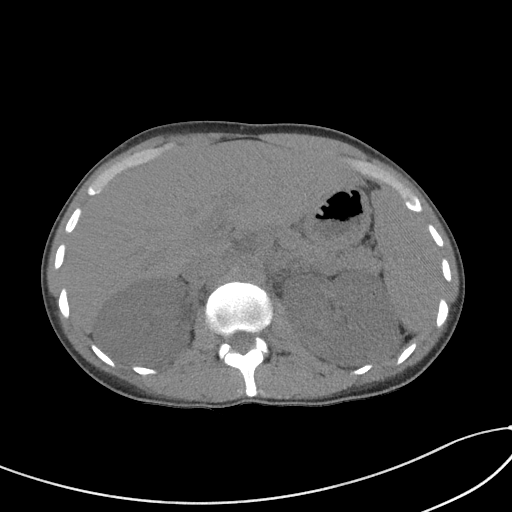
[im 69/82  soft-tissue]
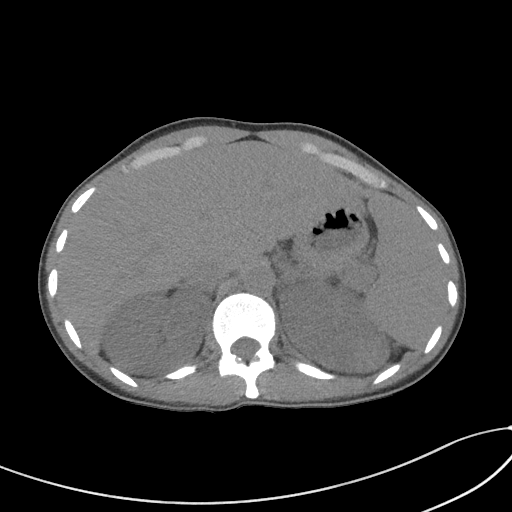
[im 77/82  soft-tissue]
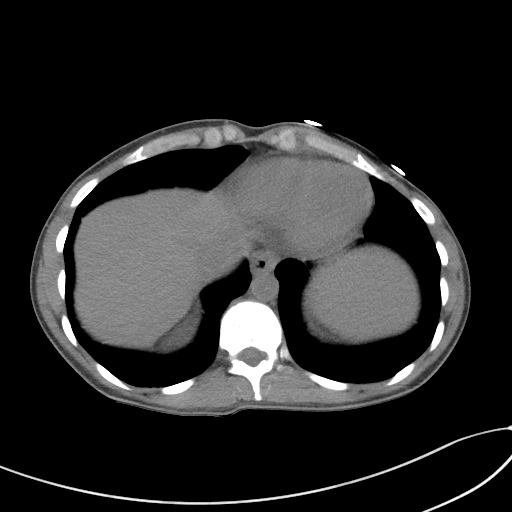

[Series 5: coronal st · coronal · 0.63mm/px · 3 of 76 slices shown]
[im 26/76  soft-tissue]
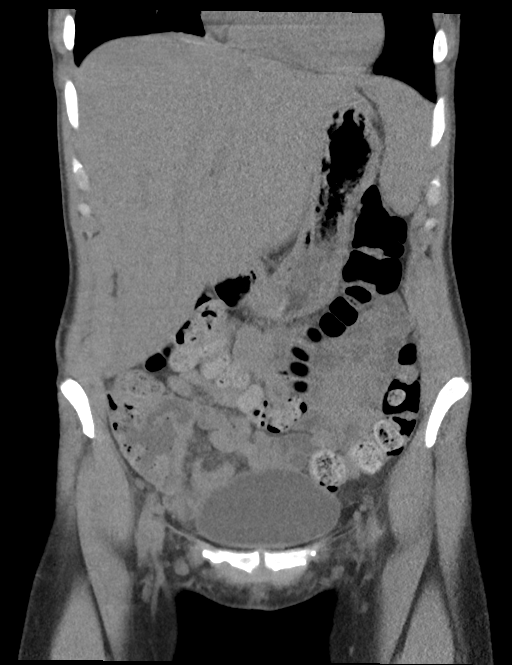
[im 34/76  soft-tissue]
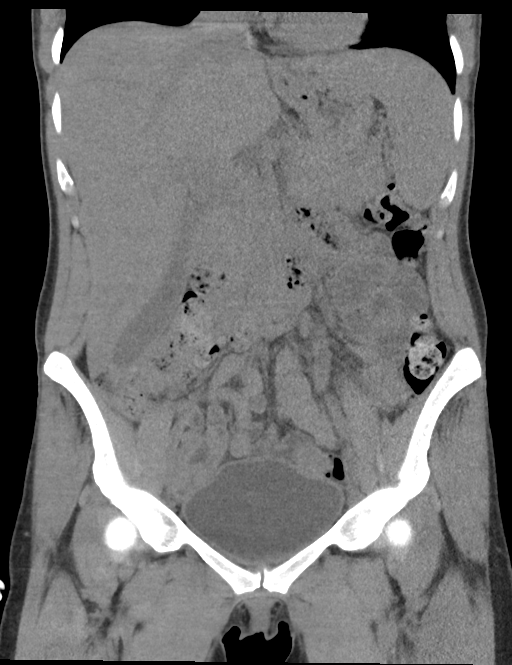
[im 42/76  soft-tissue]
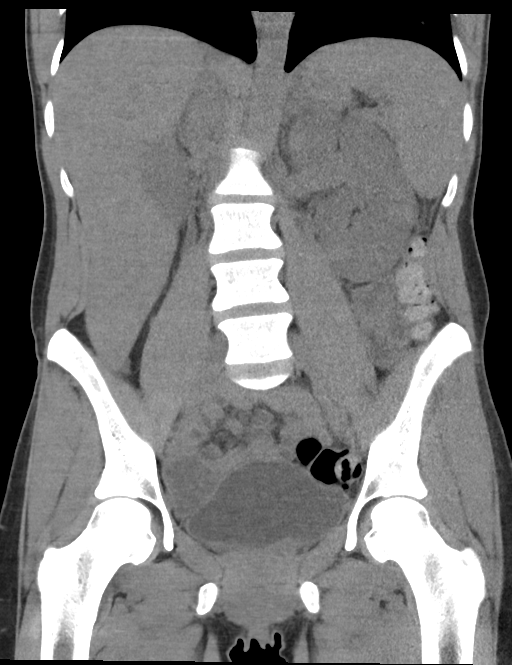

[16 of 46 positions shown; findings below may reference images not displayed]

FINDINGS: Lower chest: No acute abnormality.

Hepatobiliary: No focal liver abnormality is seen. No gallstones,
gallbladder wall thickening, or biliary dilatation.

Pancreas: Unremarkable. No pancreatic ductal dilatation or
surrounding inflammatory changes.

Spleen: Normal in size without focal abnormality.

Adrenals/Urinary Tract: Adrenal glands appear normal. Nonobstructive
left renal calculus is noted. No hydronephrosis or renal obstruction
is noted. Urinary bladder is unremarkable.

Stomach/Bowel: The stomach appears normal. There is no evidence of
bowel obstruction or inflammation. The appendix is not visualized,
but no inflammation is noted in the right lower quadrant.

Vascular/Lymphatic: No significant vascular findings are present. No
enlarged abdominal or pelvic lymph nodes.

Reproductive: Uterus is unremarkable. Probable 4 cm complex cyst
seen in right adnexal region.

Other: Mild fat containing periumbilical hernia is noted. No ascites
is noted.

Musculoskeletal: No acute or significant osseous findings.
IMPRESSION: 1. Nonobstructive left renal calculus. No hydronephrosis or renal
obstruction is noted.
2. Probable 4 cm complex cyst seen in right adnexal region. Pelvic
ultrasound is recommended for further evaluation.
3. Mild fat containing periumbilical hernia.

## 2022-09-09 IMAGING — US US PELVIS COMPLETE TRANSABD/TRANSVAG W DUPLEX
1 series · 13 of 25 positions shown · non-contrast
Comparison: CT 04/29/2020

CLINICAL DATA: Left flank pain

EXAM:
TRANSABDOMINAL AND TRANSVAGINAL ULTRASOUND OF PELVIS
DOPPLER ULTRASOUND OF OVARIES
TECHNIQUE: Both transabdominal and transvaginal ultrasound examinations of the
pelvis were performed. Transabdominal technique was performed for
global imaging of the pelvis including uterus, ovaries, adnexal
regions, and pelvic cul-de-sac.
It was necessary to proceed with endovaginal exam following the
transabdominal exam to visualize the uterus endometrium ovaries.
Color and duplex Doppler ultrasound was utilized to evaluate blood
flow to the ovaries.

[Series 1: us pelvic complete w transvaginal and torsion righ · 13 of 79 slices shown]
[im 1/79]
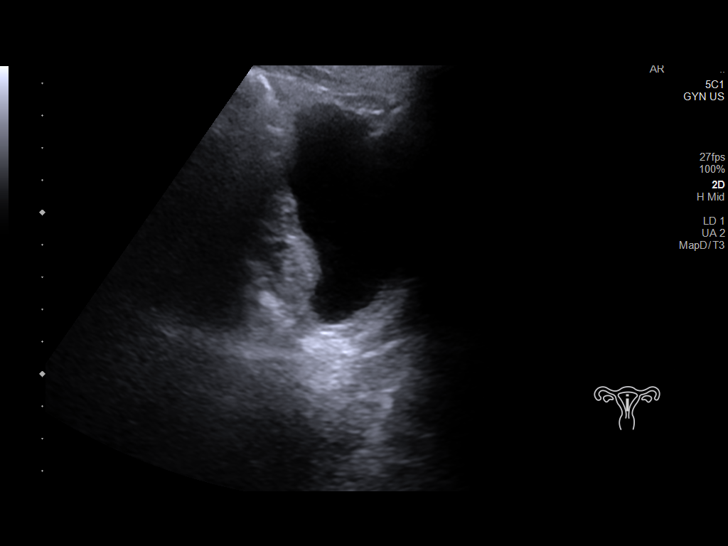
[im 7/79]
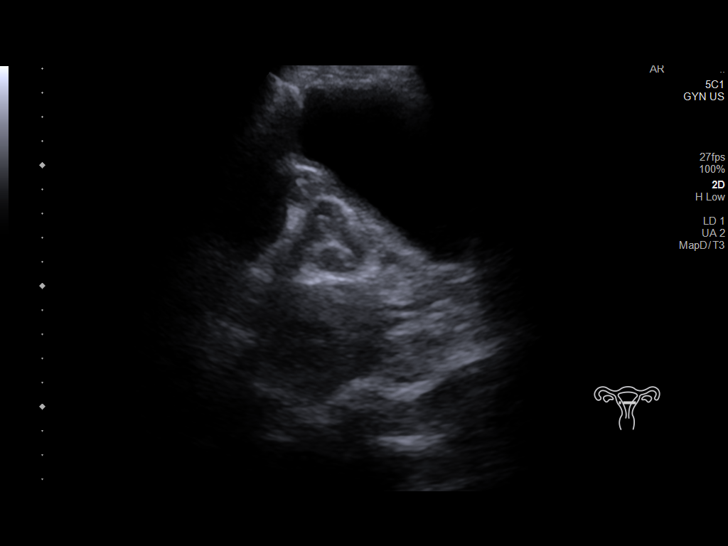
[im 14/79]
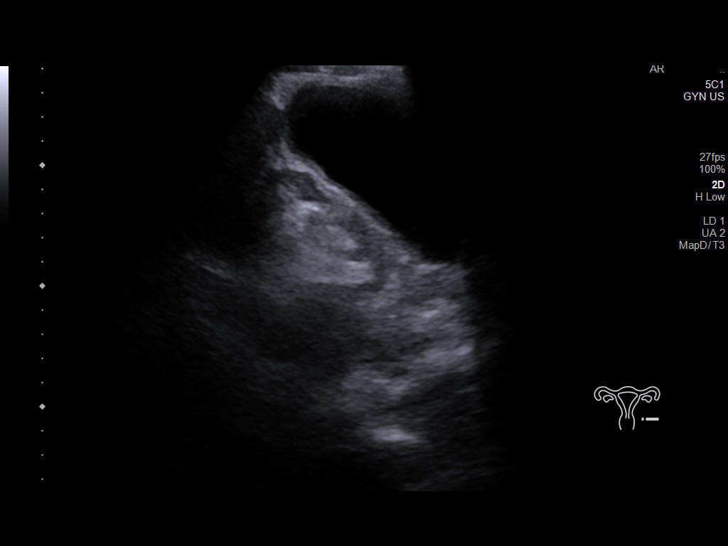
[im 20/79]
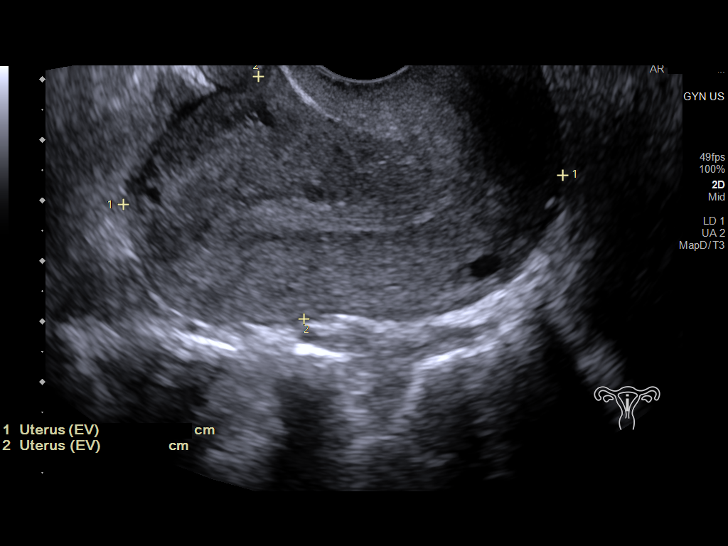
[im 27/79]
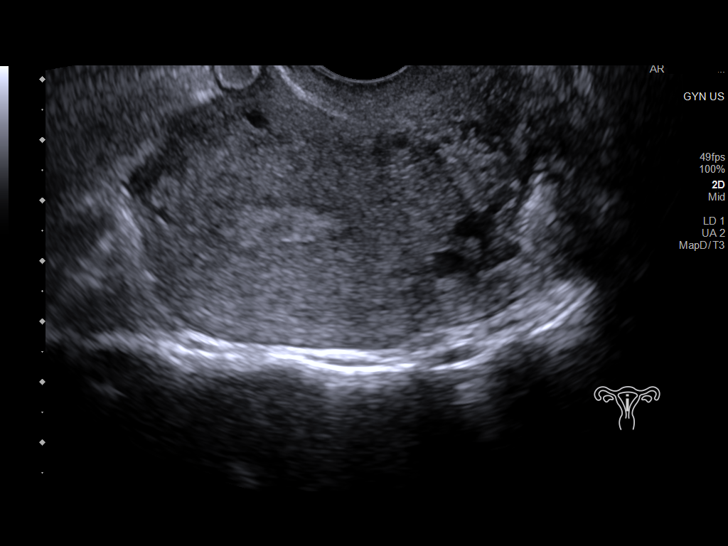
[im 33/79]
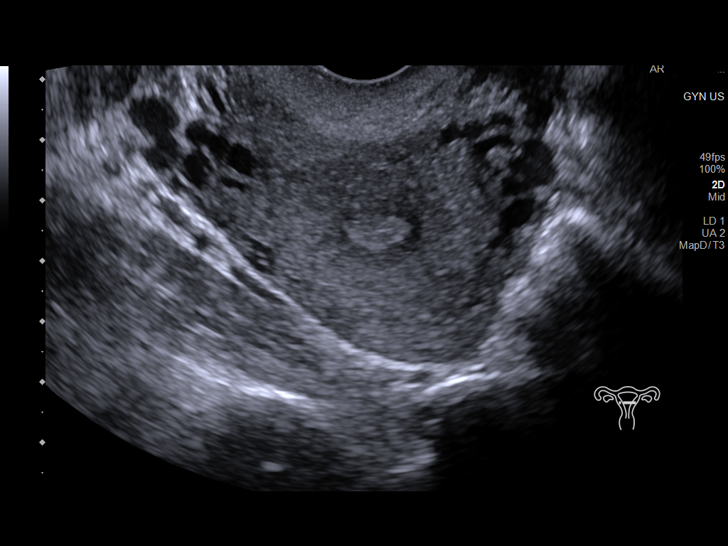
[im 40/79]
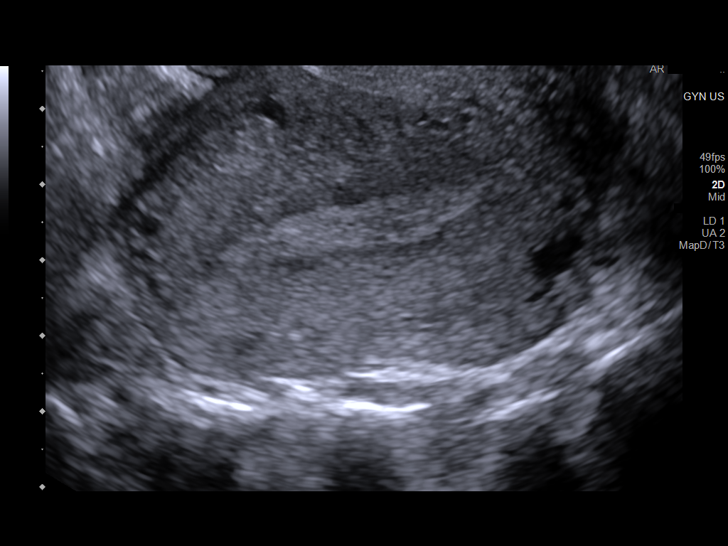
[im 46/79]
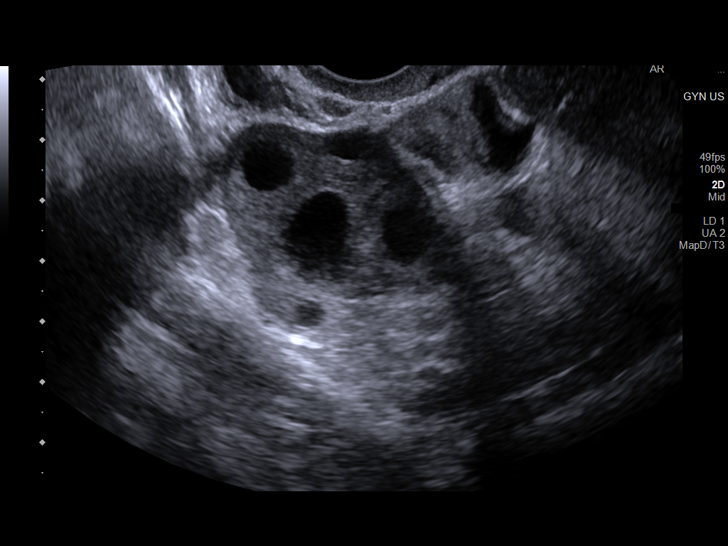
[im 53/79]
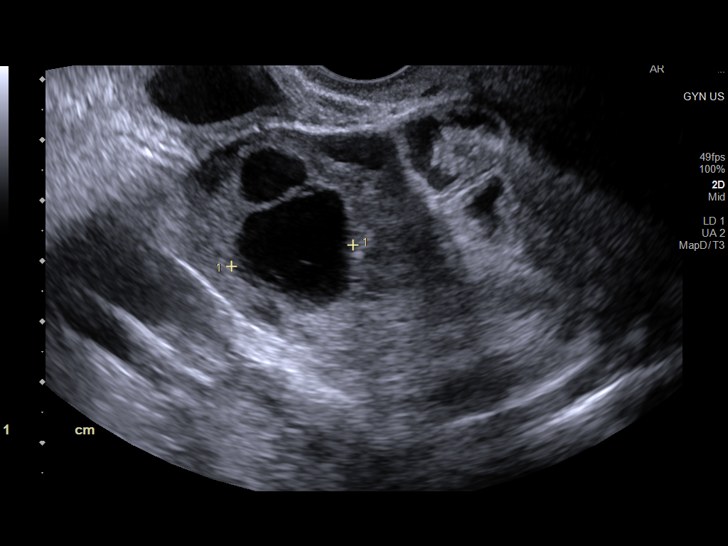
[im 59/79]
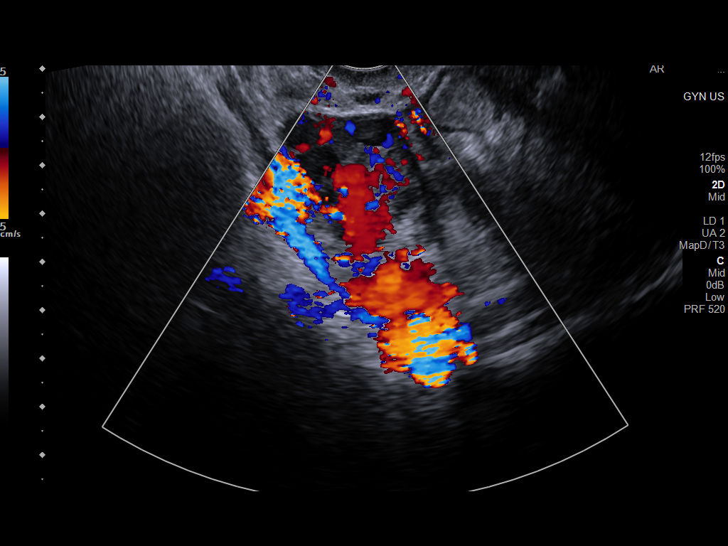
[im 66/79]
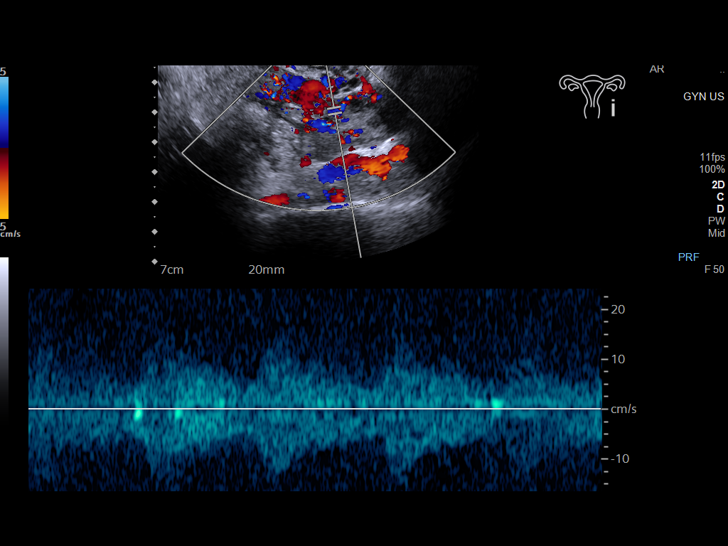
[im 72/79]
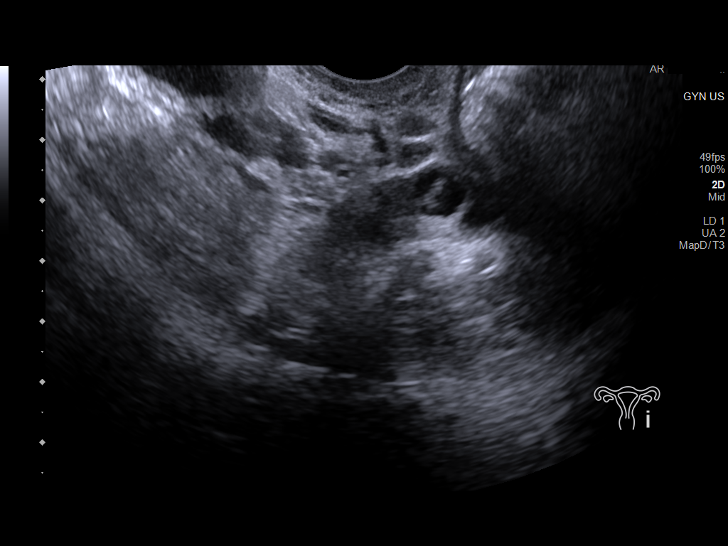
[im 79/79]
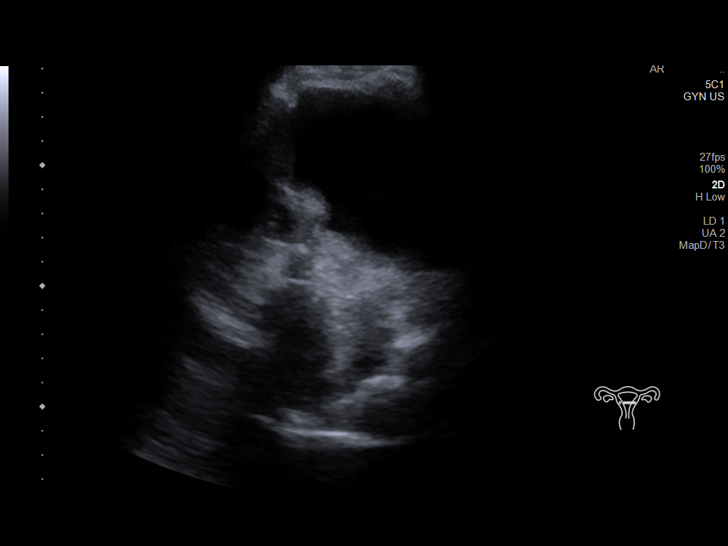

[13 of 25 positions shown; findings below may reference images not displayed]

FINDINGS: Uterus

Measurements: 7.3 x 4.1 x 5.2 cm = volume: 81.2 mL. No fibroids or
other mass visualized.

Endometrium

Thickness: 7 mm.  No focal abnormality visualized.

Right ovary

Measurements: 3.6 x 2.7 by 3.3 cm = volume: 16.5 mL. Multiple
follicles. Probable hemorrhagic follicle or small cysts measuring up
to 2.1 cm.

Left ovary

Measurements: 3.4 x 2 x 1.8 cm = volume: 6.1 mL. Normal
appearance/no adnexal mass.

Pulsed Doppler evaluation of both ovaries demonstrates normal
low-resistance arterial and venous waveforms.

Other findings

No abnormal free fluid.
IMPRESSION: 1. Negative for ovarian torsion.
2. 2.1 cm probable hemorrhagic follicle or small cyst in the right
ovary. No further imaging follow-up is recommended.
